# Patient Record
Sex: Female | Born: 1940 | Race: White | Hispanic: No | Smoking: Never smoker
Health system: Southern US, Community
[De-identification: ages and names within clinical notes are randomized; demographics above are authoritative.]

## PROBLEM LIST (undated history)

## (undated) DIAGNOSIS — G40109 Localization-related (focal) (partial) symptomatic epilepsy and epileptic syndromes with simple partial seizures, not intractable, without status epilepticus: Secondary | ICD-10-CM

## (undated) DIAGNOSIS — R569 Unspecified convulsions: Secondary | ICD-10-CM

## (undated) DIAGNOSIS — R260 Ataxic gait: Secondary | ICD-10-CM

## (undated) DIAGNOSIS — H3560 Retinal hemorrhage, unspecified eye: Secondary | ICD-10-CM

## (undated) DIAGNOSIS — E039 Hypothyroidism, unspecified: Secondary | ICD-10-CM

## (undated) DIAGNOSIS — R531 Weakness: Secondary | ICD-10-CM

## (undated) HISTORY — PX: BRAIN SURGERY: SHX531

## (undated) HISTORY — DX: Retinal hemorrhage, unspecified eye: H35.60

## (undated) HISTORY — DX: Weakness: R53.1

## (undated) HISTORY — PX: TUBAL LIGATION: SHX77

## (undated) HISTORY — PX: CHOLECYSTECTOMY: SHX55

## (undated) HISTORY — PX: ANKLE SURGERY: SHX546

## (undated) HISTORY — PX: OTHER SURGICAL HISTORY: SHX169

## (undated) HISTORY — DX: Hypothyroidism, unspecified: E03.9

---

## 1988-03-31 DIAGNOSIS — M21379 Foot drop, unspecified foot: Secondary | ICD-10-CM | POA: Insufficient documentation

## 2006-05-07 LAB — HM MAMMOGRAPHY: HM Mammogram: NORMAL

## 2009-04-19 ENCOUNTER — Ambulatory Visit: Payer: Self-pay | Admitting: Family Medicine

## 2009-04-19 DIAGNOSIS — I10 Essential (primary) hypertension: Secondary | ICD-10-CM | POA: Insufficient documentation

## 2009-04-19 DIAGNOSIS — E039 Hypothyroidism, unspecified: Secondary | ICD-10-CM | POA: Insufficient documentation

## 2009-04-26 ENCOUNTER — Encounter: Payer: Self-pay | Admitting: Family Medicine

## 2009-05-02 ENCOUNTER — Telehealth: Payer: Self-pay | Admitting: Family Medicine

## 2009-05-03 ENCOUNTER — Ambulatory Visit: Payer: Self-pay | Admitting: Family Medicine

## 2009-05-27 ENCOUNTER — Ambulatory Visit: Payer: Self-pay | Admitting: Family Medicine

## 2010-01-17 ENCOUNTER — Encounter: Payer: Self-pay | Admitting: Family Medicine

## 2010-01-19 ENCOUNTER — Encounter: Payer: Self-pay | Admitting: Family Medicine

## 2010-01-22 ENCOUNTER — Encounter: Payer: Self-pay | Admitting: Family Medicine

## 2010-02-08 ENCOUNTER — Ambulatory Visit: Payer: Self-pay | Admitting: Family Medicine

## 2010-02-08 LAB — CONVERTED CEMR LAB
Bilirubin Urine: NEGATIVE
Glucose, Urine, Semiquant: NEGATIVE
Ketones, urine, test strip: NEGATIVE
Nitrite: NEGATIVE

## 2010-02-09 ENCOUNTER — Encounter: Payer: Self-pay | Admitting: Family Medicine

## 2010-02-09 LAB — CONVERTED CEMR LAB
ALT: 12 units/L (ref 0–35)
Albumin: 4.1 g/dL (ref 3.5–5.2)
BUN: 9 mg/dL (ref 6–23)
CO2: 25 meq/L (ref 19–32)
Calcium: 10.1 mg/dL (ref 8.4–10.5)
Chloride: 105 meq/L (ref 96–112)
Creatinine, Ser: 0.88 mg/dL (ref 0.40–1.20)
HCT: 35.9 % — ABNORMAL LOW (ref 36.0–46.0)
Hemoglobin: 11.3 g/dL — ABNORMAL LOW (ref 12.0–15.0)
Potassium: 4.4 meq/L (ref 3.5–5.3)
TSH: 1.651 microintl units/mL (ref 0.350–4.500)
WBC: 5.4 10*3/uL (ref 4.0–10.5)

## 2010-03-17 ENCOUNTER — Telehealth: Payer: Self-pay | Admitting: Family Medicine

## 2010-03-27 ENCOUNTER — Ambulatory Visit: Payer: Self-pay | Admitting: Family Medicine

## 2010-03-27 DIAGNOSIS — N281 Cyst of kidney, acquired: Secondary | ICD-10-CM | POA: Insufficient documentation

## 2010-04-07 ENCOUNTER — Encounter: Payer: Self-pay | Admitting: Family Medicine

## 2010-04-07 ENCOUNTER — Encounter
Admission: RE | Admit: 2010-04-07 | Discharge: 2010-04-07 | Payer: Self-pay | Source: Home / Self Care | Admitting: Family Medicine

## 2010-04-07 ENCOUNTER — Ambulatory Visit: Payer: Self-pay | Admitting: Family Medicine

## 2010-04-26 ENCOUNTER — Telehealth: Payer: Self-pay | Admitting: Family Medicine

## 2010-05-09 ENCOUNTER — Telehealth: Payer: Self-pay | Admitting: Family Medicine

## 2010-06-06 NOTE — Assessment & Plan Note (Signed)
Summary: Flu   Vital Signs:  Patient profile:   70 year old female Height:      68.5 inches Weight:      204 pounds O2 Sat:      97 % on Room air Temp:     99.0 degrees F oral Pulse rate:   86 / minute BP sitting:   137 / 77  (left arm) Cuff size:   large  Vitals Entered By: Kathlene November (May 27, 2009 2:17 PM)  O2 Flow:  Room air  Serial Vital Signs/Assessments:                                PEF    PreRx  PostRx Time      O2 Sat  O2 Type     L/min  L/min  L/min   By 2:49 PM                       300    330    390     Kim Johnson  Comments: 2:49 PM Pt in green zone By: Kathlene November   CC: cough, head and chest congestion, H/A, temp of 102 Monday, some nausea, diarrhea   Primary Care Provider:  Nani Gasser MD  CC:  cough, head and chest congestion, H/A, temp of 102 Monday, some nausea, and diarrhea.  History of Present Illness: cough, head and chest congestion, H/A, temp of 102 Monday, some nausea, diarrhea.  Took some pepto and now stools are black.  + chills.  This am 100.4.  No flu shot. Back hurts but no myalgias. completed the cough medicine. Tylenol and triamenic.   Current Medications (verified): 1)  Synthroid 88 Mcg Tabs (Levothyroxine Sodium) .... Take One Tablet By Mouth Oncea  Day 2)  Fexofenadine Hcl 180 Mg Tabs (Fexofenadine Hcl) .... Take 1 Tablet By Mouth Once A Day  Allergies (verified): 1)  ! Pcn  Comments:  Nurse/Medical Assistant: The patient's medications and allergies were reviewed with the patient and were updated in the Medication and Allergy Lists. Kathlene November (May 27, 2009 2:18 PM)  Physical Exam  General:  Well-developed,well-nourished,in no acute distress; alert,appropriate and cooperative throughout examination Head:  Normocephalic and atraumatic without obvious abnormalities. No apparent alopecia or balding. Eyes:  No corneal or conjunctival inflammation noted. EOMI. Perrla.  Ears:  External ear exam shows no  significant lesions or deformities.  Otoscopic examination reveals clear canals, tympanic membranes are intact bilaterally without bulging, retraction, inflammation or discharge. Hearing is grossly normal bilaterally. Nose:  External nasal examination shows no deformity or inflammation. Nasal mucosa are pink and moist without lesions or exudates. Mouth:  Oral mucosa and oropharynx without lesions or exudates.  Teeth in good repair. Neck:  No deformities, masses, or tenderness noted. Lungs:  Normal respiratory effort, chest expands symmetrically. Lungs are clear to auscultation, no crackles or wheezes. Heart:  Normal rate and regular rhythm. S1 and S2 normal without gallop, murmur, click, rub or other extra sounds. Pulses:  Radial 2+ , reg.  Neurologic:  alert & oriented X3.   Skin:  no rashes.  no rashes.   Cervical Nodes:  No lymphadenopathy noted Psych:  Cognition and judgment appear intact. Alert and cooperative with normal attention span and concentration. No apparent delusions, illusions, hallucinations   Impression & Recommendations:  Problem # 1:  INFLUENZA (ICD-487.8)  Rest, increase fluids, use Tylenol [phone] mg  every 4-6 hours, and avoid contact with others. call office if no improvement in 5-7 days or if symptoms worsen.  Not a candidate for tamiflu.   Orders: Flu A+B (40981)  Complete Medication List: 1)  Synthroid 88 Mcg Tabs (Levothyroxine sodium) .... Take one tablet by mouth oncea  day 2)  Hydrocodone-homatropine 5-1.5 Mg/55ml Syrp (Hydrocodone-homatropine) .... 5ml by mouth at bedtime as needed cough 3)  Fexofenadine Hcl 180 Mg Tabs (Fexofenadine hcl) .... Take 1 tablet by mouth once a day Prescriptions: HYDROCODONE-HOMATROPINE 5-1.5 MG/5ML SYRP (HYDROCODONE-HOMATROPINE) 5ml by mouth at bedtime as needed cough  #142ml x 0   Entered and Authorized by:   Nani Gasser MD   Signed by:   Nani Gasser MD on 05/27/2009   Method used:   Print then Give to  Patient   RxID:   [id number]   Laboratory Results  Date/Time Received: 05/27/2009 Date/Time Reported: 05/27/2009  Other Tests  Influenza A: positive Influenza B: positive

## 2010-06-06 NOTE — Assessment & Plan Note (Signed)
Summary: Right knee pain   Vital Signs:  Patient profile:   70 year old female Height:      68.5 inches Weight:      196 pounds Pulse rate:   78 / minute BP sitting:   141 / 82  (right arm) Cuff size:   regular  Vitals Entered By: Avon Gully CMA, Duncan Dull) (April 07, 2010 1:08 PM) CC: rt knee pain   Primary Care Provider:  Nani Gasser MD  CC:  rt knee pain.  History of Present Illness: April Dickerson at home and she called her friend, who called EMS. They evaluated her and she didn't go to the hospital.   Happened a week ago Friday ( 1 week ago). Right knee isn't swollen. Says it ws getting a little better but now worse again. It kep her up last night.  Hx of 2 torn ligament repairs on the right knee.  Pain is along the medial knee and in the back of the knee. Hasn't taken any pain med relievers.  Pain woke her up last night.  Has been icing it. She has gait difficulty with her left knee s/p brain surgery and uses a 4 prong cane.   Current Medications (verified): 1)  Synthroid 88 Mcg Tabs (Levothyroxine Sodium) .... Take One Tablet By Mouth Oncea  Day 2)  Fexofenadine Hcl 180 Mg Tabs (Fexofenadine Hcl) .... Take 1 Tablet By Mouth Once A Day 3)  Microzide 12.5 Mg Caps (Hydrochlorothiazide) .... Take 1 Tablet By Mouth Once A Day  Allergies (verified): 1)  ! Pcn  Comments:  Nurse/Medical Assistant: The patient's medications and allergies were reviewed with the patient and were updated in the Medication and Allergy Lists. Avon Gully CMA, Duncan Dull) (April 07, 2010 1:09 PM)  Past History:  Past Surgical History: Brain surgery x 3 for hydrocephalous in her 39s.   Cholecystectomy 1980 Right knee with surgical repair of ligaments x 2  Physical Exam  General:  Well-developed,well-nourished,in no acute distress; alert,appropriate and cooperative throughout examination Msk:  Right knee with midl swelling. Tender along bilateral joint lines.  No locking. No crepitus.  IN  creased laxity with anterior drawer. She also has inc laxity with ant drawer on the left. Strength 5/5   Impression & Recommendations:  Problem # 1:  KNEE PAIN, RIGHT (ICD-719.46) Assessment New Discussed will treat conservatively Given pain medication for nighttime. Can use Tylenol during the day. PRICE therapy.  Will get xray since fall and rule out fracture.   If not getting better in 2-3 weeks and the xray is neg then refer to ortho for further evaluation.   Her updated medication list for this problem includes:    Tylenol With Codeine #3 300-30 Mg Tabs (Acetaminophen-codeine) .Marland Kitchen... 1-2 tab by mouth every 6 hours as needed severe pain.  Orders: T-DG Knee 2 Views*R* (73560)  Complete Medication List: 1)  Synthroid 88 Mcg Tabs (Levothyroxine sodium) .... Take one tablet by mouth oncea  day 2)  Fexofenadine Hcl 180 Mg Tabs (Fexofenadine hcl) .... Take 1 tablet by mouth once a day 3)  Microzide 12.5 Mg Caps (Hydrochlorothiazide) .... Take 1 tablet by mouth once a day 4)  Tylenol With Codeine #3 300-30 Mg Tabs (Acetaminophen-codeine) .Marland Kitchen.. 1-2 tab by mouth every 6 hours as needed severe pain.  Patient Instructions: 1)  We will call you with the xray results  2)  Can you the pain mediction as needed  Prescriptions: TYLENOL WITH CODEINE #3 300-30 MG TABS (ACETAMINOPHEN-CODEINE) 1-2  tab by mouth every 6 hours as needed severe pain.  #45 x 0   Entered and Authorized by:   Nani Gasser MD   Signed by:   Nani Gasser MD on 04/07/2010   Method used:   Printed then faxed to ...       [facility] [phone]* (retail)       [address] [place], [place]  [address]       Ph: [phone]       Fax: [phone]   RxID:   [id number]    Orders Added: 1)  T-DG Knee 2 Views*R* [73560] 2)  Est. Patient Level IV [60737]

## 2010-06-06 NOTE — Letter (Signed)
Summary: Internal Correspondence-call a nurse report  Internal Correspondence-call a nurse report   Imported By: Vanessa Swaziland 05/27/2009 08:18:35  _____________________________________________________________________  External Attachment:    Type:   Image     Comment:   Internal Document

## 2010-06-06 NOTE — Progress Notes (Signed)
Summary: pt request a referral   Phone Note Call from Patient   Caller: Patient Summary of Call: Pt is asking for a referral to Dr. Burnett Corrente  in [place] be faxed to [phone]... Call pt. and let her know when this has been faxed so they will let her schedule a appt with them, [phone] Initial call taken by: Michaelle Copas,  March 17, 2010 12:26 PM  Follow-up for Phone Call        What for?  Have I evaluated ths problem?  Follow-up by: Nani Gasser MD,  March 17, 2010 12:56 PM  Additional Follow-up for Phone Call Additional follow up Details #1::        Her Brain Surgeon has passed away and now she needs a f/u with a Neurologist... She has a Hx. of Brain Surgery and likes to have a f/u every so often... Additional Follow-up by: Michaelle Copas,  March 17, 2010 3:02 PM

## 2010-06-06 NOTE — Letter (Signed)
Summary: [facility]  [facility]   Imported By: Lanelle Bal 02/17/2010 10:11:34  _____________________________________________________________________  External Attachment:    Type:   Image     Comment:   External Document

## 2010-06-06 NOTE — Assessment & Plan Note (Signed)
Summary: 6 week f/u BP, renal cysts   Vital Signs:  Patient profile:   70 year old female Height:      68.5 inches Weight:      196 pounds Pulse rate:   77 / minute BP sitting:   139 / 77  (right arm) Cuff size:   regular  Vitals Entered By: Avon Gully CMA, Duncan Dull) (March 27, 2010 1:04 PM)  Serial Vital Signs/Assessments:  Time      Position  BP       Pulse  Resp  Temp     By 1:08 PM             145/72                         Avon Gully CMA, (AAMA)  CC: f/u BP   Primary Care Provider:  Nani Gasser MD  CC:  f/u BP.  History of Present Illness: Down 5 lb.s Has really cut out the salt in her diet.  She is doing well.   Current Medications (verified): 1)  Synthroid 88 Mcg Tabs (Levothyroxine Sodium) .... Take One Tablet By Mouth Oncea  Day 2)  Fexofenadine Hcl 180 Mg Tabs (Fexofenadine Hcl) .... Take 1 Tablet By Mouth Once A Day  Allergies (verified): 1)  ! Pcn  Comments:  Nurse/Medical Assistant: The patient's medications and allergies were reviewed with the patient and were updated in the Medication and Allergy Lists. Avon Gully CMA, Duncan Dull) (March 27, 2010 1:08 PM)  Physical Exam  General:  Well-developed,well-nourished,in no acute distress; alert,appropriate and cooperative throughout examination Head:  Normocephalic and atraumatic without obvious abnormalities. No apparent alopecia or balding. Lungs:  Normal respiratory effort, chest expands symmetrically. Lungs are clear to auscultation, no crackles or wheezes. Heart:  Normal rate and regular rhythm. S1 and S2 normal without gallop, murmur, click, rub or other extra sounds. Skin:  no rashes.     Impression & Recommendations:  Problem # 1:  HYPERTENSION, BENIGN (ICD-401.1) Discussed different options. I am very happy about her weight loss. She has dong a great job changing her diet.  Will start wtih low dose diuretic adn follow up in one month. Check BMET at that time.  Her updated  medication list for this problem includes:    Microzide 12.5 Mg Caps (Hydrochlorothiazide) .Marland Kitchen... Take 1 tablet by mouth once a day  BP today: 139/77 Prior BP: 142/70 (02/08/2010)  Labs Reviewed: Creat: 0.88 (02/08/2010)  Instructed in low sodium diet (DASH Handout) and behavior modification.    Problem # 2:  RENAL CYST (ICD-593.2) Will f/u wiht repeat US at [facility] in 3 months.   Complete Medication List: 1)  Synthroid 88 Mcg Tabs (Levothyroxine sodium) .... Take one tablet by mouth oncea  day 2)  Fexofenadine Hcl 180 Mg Tabs (Fexofenadine hcl) .... Take 1 tablet by mouth once a day 3)  Microzide 12.5 Mg Caps (Hydrochlorothiazide) .... Take 1 tablet by mouth once a day  Patient Instructions: 1)  Please schedule a follow-up appointment in 1 month to recheck BP.  Prescriptions: SYNTHROID 88 MCG TABS (LEVOTHYROXINE SODIUM) Take one tablet by mouth oncea  day Brand medically necessary #90 x 1   Entered and Authorized by:   Nani Gasser MD   Signed by:   Nani Gasser MD on 03/27/2010   Method used:   Print then Give to Patient   RxID:   [id number] MICROZIDE 12.5 MG CAPS (HYDROCHLOROTHIAZIDE) Take 1 tablet  by mouth once a day  #30 x 2   Entered and Authorized by:   Nani Gasser MD   Signed by:   Nani Gasser MD on 03/27/2010   Method used:   Print then Give to Patient   RxID:   [id number]    Orders Added: 1)  Est. Patient Level III [56213]

## 2010-06-06 NOTE — Assessment & Plan Note (Signed)
Summary: Hospital f/u   Vital Signs:  Patient profile:   70 year old female Height:      68.5 inches Weight:      201 pounds Pulse rate:   74 / minute BP sitting:   142 / 70  (right arm) Cuff size:   regular  Vitals Entered By: Avon Gully CMA, Duncan Dull) (February 08, 2010 8:22 AM) CC: hospital f/u   Serial Vital Signs/Assessments:  Time      Position  BP       Pulse  Resp  Temp     By 8:22 AM             126/72                         Avon Gully CMA, (Duncan Dull)   Primary Care Provider:  Nani Gasser MD  CC:  hospital f/u.  History of Present Illness: Admitted to hospital for RLL PNA and Left pyelnephritis.  Admitted with low BP and probable septsis.  We are called to get her actual D/C summary. Completed her course of Cipro one week ago. Feeling much better.  Still some swelling the left ankle.  Lost 20 lbs. Dec appetite now.  Eating less than 1500mg  of salt. Taking her syntrhoid and her allegra as needed.  No SOB or fever.  Less urinary frequency.   Current Medications (verified): 1)  Synthroid 88 Mcg Tabs (Levothyroxine Sodium) .... Take One Tablet By Mouth Oncea  Day 2)  Fexofenadine Hcl 180 Mg Tabs (Fexofenadine Hcl) .... Take 1 Tablet By Mouth Once A Day  Allergies (verified): 1)  ! Pcn  Comments:  Nurse/Medical Assistant: The patient's medications and allergies were reviewed with the patient and were updated in the Medication and Allergy Lists. Avon Gully CMA, Duncan Dull) (February 08, 2010 8:23 AM)  Past History:  Past Surgical History: Last updated: 04/19/2009 Brain surgery x 3 for hydrocephalous in her 40s.   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  [employer].  2 adult kids.  Former Smoker Alcohol use-no Drug use-no Regular exercise-yes  Physical Exam  General:  Well-developed,well-nourished,in no acute distress; alert,appropriate and cooperative throughout examination Head:  Normocephalic  and atraumatic without obvious abnormalities. No apparent alopecia or balding. Neck:  No deformities, masses, or tenderness noted. Lungs:  Normal respiratory effort, chest expands symmetrically. Lungs are clear to auscultation, no crackles or wheezes. Heart:  Normal rate and regular rhythm. S1 and S2 normal without gallop, murmur, click, rub or other extra sounds. No abdominal bruits.  Abdomen:  Bowel sounds positive,abdomen soft and non-tender without masses, organomegaly or hernias noted. Msk:  Trace edema in the left ankle.  Neurologic:  alert & oriented X3.  Needs assistance to get up on the exam table. Uses a cane.  Skin:  no rashes.   Cervical Nodes:  No lymphadenopathy noted Psych:  Cognition and judgment appear intact. Alert and cooperative with normal attention span and concentration. No apparent delusions, illusions, hallucinations   Impression & Recommendations:  Problem # 1:  PNEUMONIA (ICD-486) Doing well. She is much improved.  Will check lab work to make sure blood counts are normal.   Lung exam is normal.  Also completed her handicap driver placard form.  Orders: T-Comprehensive Metabolic Panel ([id number]) T-CBC No Diff ([id number])  Problem # 2:  PYELONEPHRITIS (ICD-590.80)  Recheck UA to make sure infection resolved from her pyelonepritis.  Orders: T-CBC  No Diff ([id number]) T-Urine Culture (Spectrum Order) ([phone]) UA Dipstick w/o Micro (automated)  (81003)  Encouraged to push clear liquids, get enough rest, and take acetaminophen as needed. To be seen in 10 days if no improvement, sooner if worse.  Problem # 3:  UNSPECIFIED HYPOTHYROIDISM (ICD-244.9) Due to recheck level.  Her updated medication list for this problem includes:    Synthroid 88 Mcg Tabs (Levothyroxine sodium) .Marland Kitchen... Take one tablet by mouth oncea  day  Orders: T-TSH ([id number])  Problem # 4:  ELEVATED BLOOD PRESSURE WITHOUT DIAGNOSIS OF HYPERTENSION (ICD-796.2) Up today but still  recovering. Last BP here was OK. Will have her f/u in 6 weeks to recheck.   Complete Medication List: 1)  Synthroid 88 Mcg Tabs (Levothyroxine sodium) .... Take one tablet by mouth oncea  day 2)  Fexofenadine Hcl 180 Mg Tabs (Fexofenadine hcl) .... Take 1 tablet by mouth once a day  Contraindications/Deferment of Procedures/Staging:    Test/Procedure: FLU VAX    Reason for deferment: patient declined   Patient Instructions: 1)  Please schedule a follow-up appointment in 6 weeks for blood pressure. 2)  We will call you with the lab results.   3)  Think about the shingles and pneumonia vaccines  Laboratory Results   Urine Tests  Date/Time Received: 02/08/10 Date/Time Reported: 02/08/10  Routine Urinalysis   Color: yellow Appearance: Clear Glucose: negative   (Normal Range: Negative) Bilirubin: negative   (Normal Range: Negative) Ketone: negative   (Normal Range: Negative) Spec. Gravity: 1.010   (Normal Range: 1.003-1.035) Blood: trace-intact   (Normal Range: Negative) pH: 6.0   (Normal Range: 5.0-8.0) Protein: negative   (Normal Range: Negative) Urobilinogen: 0.2   (Normal Range: 0-1) Nitrite: negative   (Normal Range: Negative) Leukocyte Esterace: small   (Normal Range: Negative)

## 2010-06-06 NOTE — Consult Note (Signed)
Summary: [facility]  [facility]   Imported By: Lanelle Bal 05/13/2009 09:20:20  _____________________________________________________________________  External Attachment:    Type:   Image     Comment:   External Document

## 2010-06-06 NOTE — Letter (Signed)
Summary: Meds & Instructions/[facility]  Meds & Instructions/[facility]   Imported By: Lanelle Bal 02/01/2010 13:47:25  _____________________________________________________________________  External Attachment:    Type:   Image     Comment:   External Document

## 2010-06-08 NOTE — Progress Notes (Signed)
Summary: Needs renal US  ---- Converted from flag ---- ---- 03/27/2010 1:15 PM, Nani Gasser MD wrote: Needs f/u US of thekidneys to folllow her cysts seen on CT abd in September. ------------------------------  10:14 AM McCrimmon CMA, (AAMA), Sue Lush May 09, 2010' left message on pt's vm with above

## 2010-06-08 NOTE — Progress Notes (Signed)
Summary: renal doppler  Phone Note Call from Patient   Caller: Patient Call For: Nani Gasser MD Summary of Call: pt called and states she did not want to schedule renal dopplers at this time because she has too many other medical expenses to pay for and will call back when she wants [place] to schedule it  Initial call taken by: Avon Gully CMA, Duncan Dull),  May 09, 2010 10:19 AM

## 2010-06-08 NOTE — Progress Notes (Signed)
Summary: Tylenol #3 refill  Phone Note Call from Patient Call back at [phone]   Caller: Patient Call For: April Gasser MD Summary of Call: Pt calls and states is going out of town to [place] for 1-2 months and wanted to know if you would call in her Tylenol #3 to [facility] for her Initial call taken by: Kathlene November LPN,  April 26, 2010 1:23 PM  Follow-up for Phone Call        OK will send.  Follow-up by: April Gasser MD,  April 26, 2010 1:29 PM  Additional Follow-up for Phone Call Additional follow up Details #1::        Pt notifeid. Med faxed Additional Follow-up by: Kathlene November LPN,  April 26, 2010 1:34 PM    Prescriptions: TYLENOL WITH CODEINE #3 300-30 MG TABS (ACETAMINOPHEN-CODEINE) 1-2 tab by mouth every 6 hours as needed severe pain.  #45 x 0   Entered and Authorized by:   April Gasser MD   Signed by:   April Gasser MD on 04/26/2010   Method used:   Printed then faxed to ...       [facility] [phone]* (retail)       [address] [place], [place]  [address]       Ph: [phone]       Fax: [phone]   RxID:   [id number]

## 2010-12-18 ENCOUNTER — Telehealth: Payer: Self-pay | Admitting: Family Medicine

## 2010-12-18 ENCOUNTER — Ambulatory Visit (INDEPENDENT_AMBULATORY_CARE_PROVIDER_SITE_OTHER): Payer: Medicare Other | Admitting: Family Medicine

## 2010-12-18 VITALS — BP 143/72 | HR 68 | Wt 200.0 lb

## 2010-12-18 DIAGNOSIS — I10 Essential (primary) hypertension: Secondary | ICD-10-CM

## 2010-12-18 DIAGNOSIS — Z23 Encounter for immunization: Secondary | ICD-10-CM

## 2010-12-18 NOTE — Telephone Encounter (Signed)
error 

## 2010-12-18 NOTE — Progress Notes (Signed)
  Subjective:    Patient ID: April Dickerson, female    DOB: 13-Feb-1941, 70 y.o.   MRN: [id number]  HPI  Pt denies chest pain, SOB, dizziness, or heart palpitations.  5 min spent with pt. TB skin test placed w/out complications     Review of Systems     Objective:   Physical Exam        Assessment & Plan:

## 2010-12-18 NOTE — Progress Notes (Signed)
  Subjective:    Patient ID: April Dickerson, female    DOB: 05-23-1940, 70 y.o.   MRN: [id number]  HPI    Review of Systems     Objective:   Physical Exam        Assessment & Plan:  HTN - not well controlled.  Needs make appt with MD.

## 2010-12-20 LAB — TB SKIN TEST

## 2010-12-27 ENCOUNTER — Encounter: Payer: Self-pay | Admitting: Family Medicine

## 2010-12-28 ENCOUNTER — Encounter: Payer: Self-pay | Admitting: Family Medicine

## 2010-12-28 ENCOUNTER — Ambulatory Visit (INDEPENDENT_AMBULATORY_CARE_PROVIDER_SITE_OTHER): Payer: Medicare Other | Admitting: Family Medicine

## 2010-12-28 VITALS — BP 124/66 | HR 78 | Wt 202.0 lb

## 2010-12-28 DIAGNOSIS — Z13220 Encounter for screening for lipoid disorders: Secondary | ICD-10-CM

## 2010-12-28 DIAGNOSIS — D239 Other benign neoplasm of skin, unspecified: Secondary | ICD-10-CM

## 2010-12-28 DIAGNOSIS — E039 Hypothyroidism, unspecified: Secondary | ICD-10-CM

## 2010-12-28 DIAGNOSIS — IMO0001 Reserved for inherently not codable concepts without codable children: Secondary | ICD-10-CM

## 2010-12-28 DIAGNOSIS — D229 Melanocytic nevi, unspecified: Secondary | ICD-10-CM

## 2010-12-28 DIAGNOSIS — R030 Elevated blood-pressure reading, without diagnosis of hypertension: Secondary | ICD-10-CM

## 2010-12-28 MED ORDER — ACETAMINOPHEN-CODEINE 300-30 MG PO TABS: 2.0000 | ORAL_TABLET | Freq: Four times a day (QID) | ORAL | Status: DC | PRN

## 2010-12-28 MED ORDER — DIPHENOXYLATE-ATROPINE 2.5-0.025 MG PO TABS: 1.0000 | ORAL_TABLET | Freq: Four times a day (QID) | ORAL | Status: AC | PRN

## 2010-12-28 MED ORDER — LEVOTHYROXINE SODIUM 88 MCG PO TABS: 88.0000 ug | ORAL_TABLET | Freq: Every day | ORAL | Status: DC

## 2010-12-28 NOTE — Progress Notes (Signed)
  Subjective:    Patient ID: April Dickerson, female    DOB: January 13, 1941, 70 y.o.   MRN: [id number]  HPI Felt more dizzy than usual, notice it turning in bed on Tuesday AM. Then woke up nauseated on Wed.  Felt like a "turning dizzy" sensation.  Had some pain and itch in her LT ear. Had  Leg cramp one night, thinks maybe the Rt knee.  Usually takes tonic water with quinine.  Note she does have a history of gait abnormality. No fever. No sore throat. No hearing changes. No head trauma that she does have a prior history of brain surgery.   Review of Systems     Objective:   Physical Exam  Constitutional: She is oriented to person, place, and time. She appears well-developed and well-nourished.  HENT:  Head: Normocephalic and atraumatic.  Right Ear: External ear normal.  Left Ear: External ear normal.       Left TM and canal were normal. She did have cerumen blocking the canal initially but this was manually disimpacted after a trial of irrigation. The right TM and canal are clear.  Neck: Neck supple. No thyromegaly present.  Cardiovascular: Normal rate, regular rhythm and normal heart sounds.   Pulmonary/Chest: Effort normal and breath sounds normal.  Lymphadenopathy:    She has no cervical adenopathy.  Neurological: She is alert and oriented to person, place, and time. No cranial nerve deficit.       Patellar reflexes 2+. She had a positive Dix-Hallpike maneuver to the left. Speech is normal.  Skin: Skin is warm and dry.  Psychiatric: She has a normal mood and affect. Her behavior is normal.          Assessment & Plan:  Benign positional vertigo-discussed the most likely diagnosis with the patient. She was given handout and demonstrated how to do exercises too fatigued response as well as improve her symptoms. She can certainly use over-the-counter meclizine as needed if she would like. If she's not improving over the next 2 weeks please call our office if she does have some other  risk factors such as prior brain surgery. Her ear exam is normal which is reassuring. Also recommend avoiding quick movements.  Note she would also like a refill on her Tylenol #3 as well to [place] since she is leaving for a trip soon. She declined a flu vaccine today.

## 2010-12-28 NOTE — Patient Instructions (Addendum)
Check out the DASH diet for weight loss.  Follow up in 6 months.  Can start the exercises for vertigo. You can meclizine prn (this is over the counter for dizziness if would like. Thought it won't cure anything.  Call if you are not better in about 2 weeks.

## 2010-12-28 NOTE — Assessment & Plan Note (Signed)
She is to recheck her level today. Lab slip given.

## 2011-02-08 ENCOUNTER — Ambulatory Visit: Payer: Medicare Other | Admitting: Family Medicine

## 2011-02-08 DIAGNOSIS — Z0289 Encounter for other administrative examinations: Secondary | ICD-10-CM

## 2011-03-21 ENCOUNTER — Encounter: Payer: Self-pay | Admitting: Emergency Medicine

## 2011-03-21 ENCOUNTER — Emergency Department (INDEPENDENT_AMBULATORY_CARE_PROVIDER_SITE_OTHER)
Admission: EM | Admit: 2011-03-21 | Discharge: 2011-03-21 | Disposition: A | Discharge status: Home / Self Care | Payer: Medicare Other | Source: Home / Self Care | Attending: Family Medicine | Admitting: Family Medicine

## 2011-03-21 DIAGNOSIS — M79641 Pain in right hand: Secondary | ICD-10-CM

## 2011-03-21 DIAGNOSIS — M79609 Pain in unspecified limb: Secondary | ICD-10-CM

## 2011-03-21 NOTE — ED Provider Notes (Signed)
History     CSN: [id number] Arrival date & time: 03/21/2011 11:23 AM   First MD Initiated Contact with Patient 03/21/11 1201      Chief Complaint  Patient presents with  . Hand Pain     HPI Comments: Patient complains of two week history of stiffness and soreness in the palm of her right hand centered over the 3rd MCP joint.  There has been mild swelling.  No known trauma.  Patient is a 70 y.o. female presenting with hand pain. The history is provided by the patient.  Hand Pain This is a new problem. The current episode started more than 1 week ago. The problem occurs constantly. The problem has been gradually worsening. Exacerbated by: grasping. The symptoms are relieved by nothing. Treatments tried: analgesic. The treatment provided mild relief.    Past Medical History  Diagnosis Date  . Weakness of left side of body     chronic/status post brain surgery    Past Surgical History  Procedure Date  . Cholecystectomy   . Rt knee surgery     surgical repair of ligaments X 2  . Brain surgery     History reviewed. No pertinent family history.  History  Substance Use Topics  . Smoking status: Former Games developer  . Smokeless tobacco: Not on file  . Alcohol Use: No    OB History    Grav Para Term Preterm Abortions TAB SAB Ect Mult Living                  Review of Systems  Constitutional: Negative.   Musculoskeletal: Positive for joint swelling.       Pain and stiffness when grasping with right hand     Allergies  Penicillins  Home Medications   Current Outpatient Rx  Name Route Sig Dispense Refill  . ACETAMINOPHEN-CODEINE 300-30 MG PO TABS Oral Take 2 tablets by mouth every 6 (six) hours as needed. 60 tablet 0  . HYDROCHLOROTHIAZIDE 12.5 MG PO CAPS Oral Take 12.5 mg by mouth every morning.      Marland Kitchen LEVOTHYROXINE SODIUM 88 MCG PO TABS Oral Take 1 tablet (88 mcg total) by mouth daily. 30 tablet 6    BP 156/76  Pulse 74  Temp(Src) 97.7 F (36.5 C) (Oral)  Resp  18  Ht 5\' 9"  (1.753 m)  Wt 204 lb (92.534 kg)  BMI 30.13 kg/m2  SpO2 96%  Physical Exam  Constitutional: She appears well-developed and well-nourished. No distress.  Musculoskeletal:       Hands:      Mild tenderness to palpation over 3rd MCP joint palmar surface, extending proximally to palmar flexor tendon.  Flexor tendon is rather prominent.  Third finger has good range of motion.  Distal Neurovascular function is intact.     ED Course  Procedures None      1. Hand pain, right       MDM  X-ray of right hand ordered; patient stated that she had to go to work and would return later in day for x-ray.  However, she did not return for hand X-ray.  Based on exam and history, suspect either a tendonitis of hand flexor tendon, or possibly early Dupuytren's contracture.  Followup call on 03/23/11:  Patient states that she plans to visit an orthopedist for her hand problem.        Donna Christen, MD 03/23/11 [phone]

## 2011-03-21 NOTE — ED Notes (Signed)
Rt hand pain x 2 weeks, denies trauma

## 2011-06-24 IMAGING — CR DG KNEE COMPLETE 4+V*R*
4 series · 4 of 4 positions shown · non-contrast
Comparison: None.

CLINICAL DATA: 69-year-old female with right knee pain, history of
fall.  Swelling.

RIGHT KNEE - COMPLETE 4+ VIEW

[view not recorded (1 of 4)]
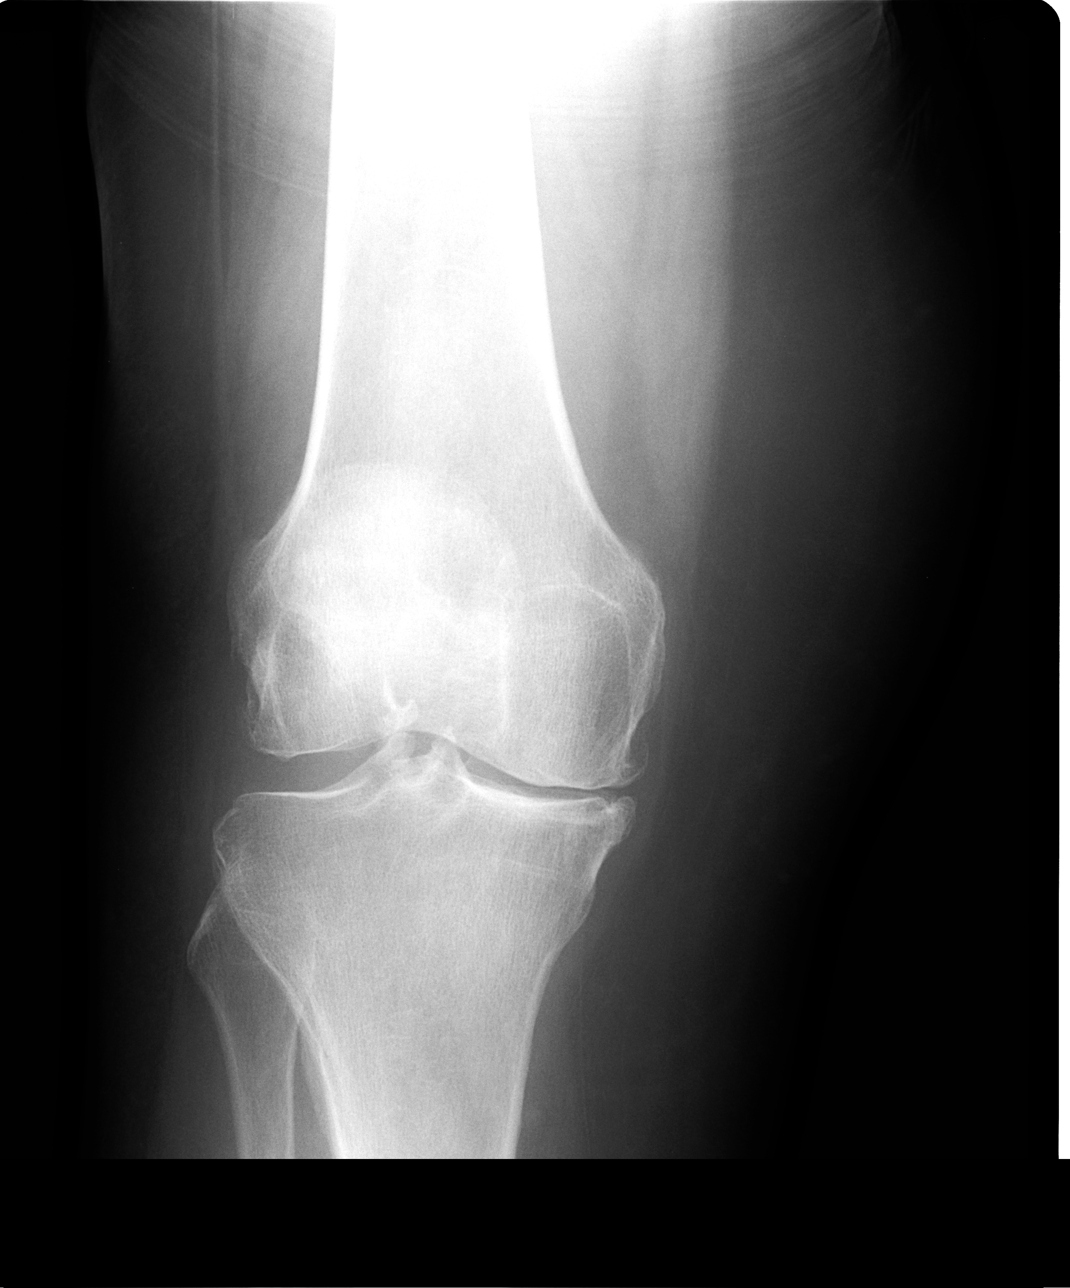

[view not recorded (2 of 4)]
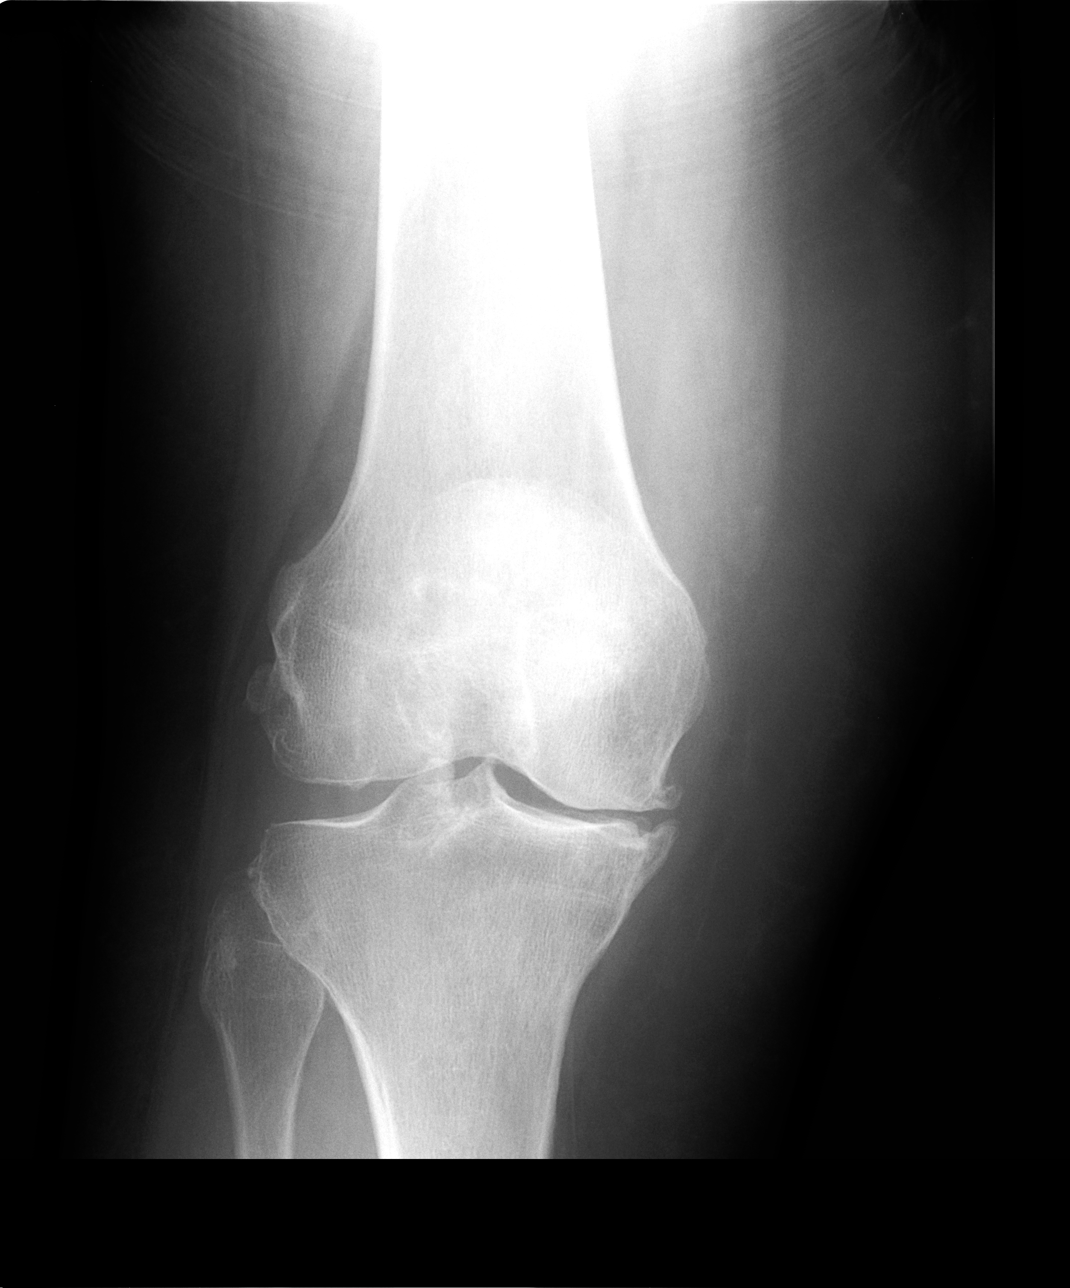

[view not recorded (3 of 4)]
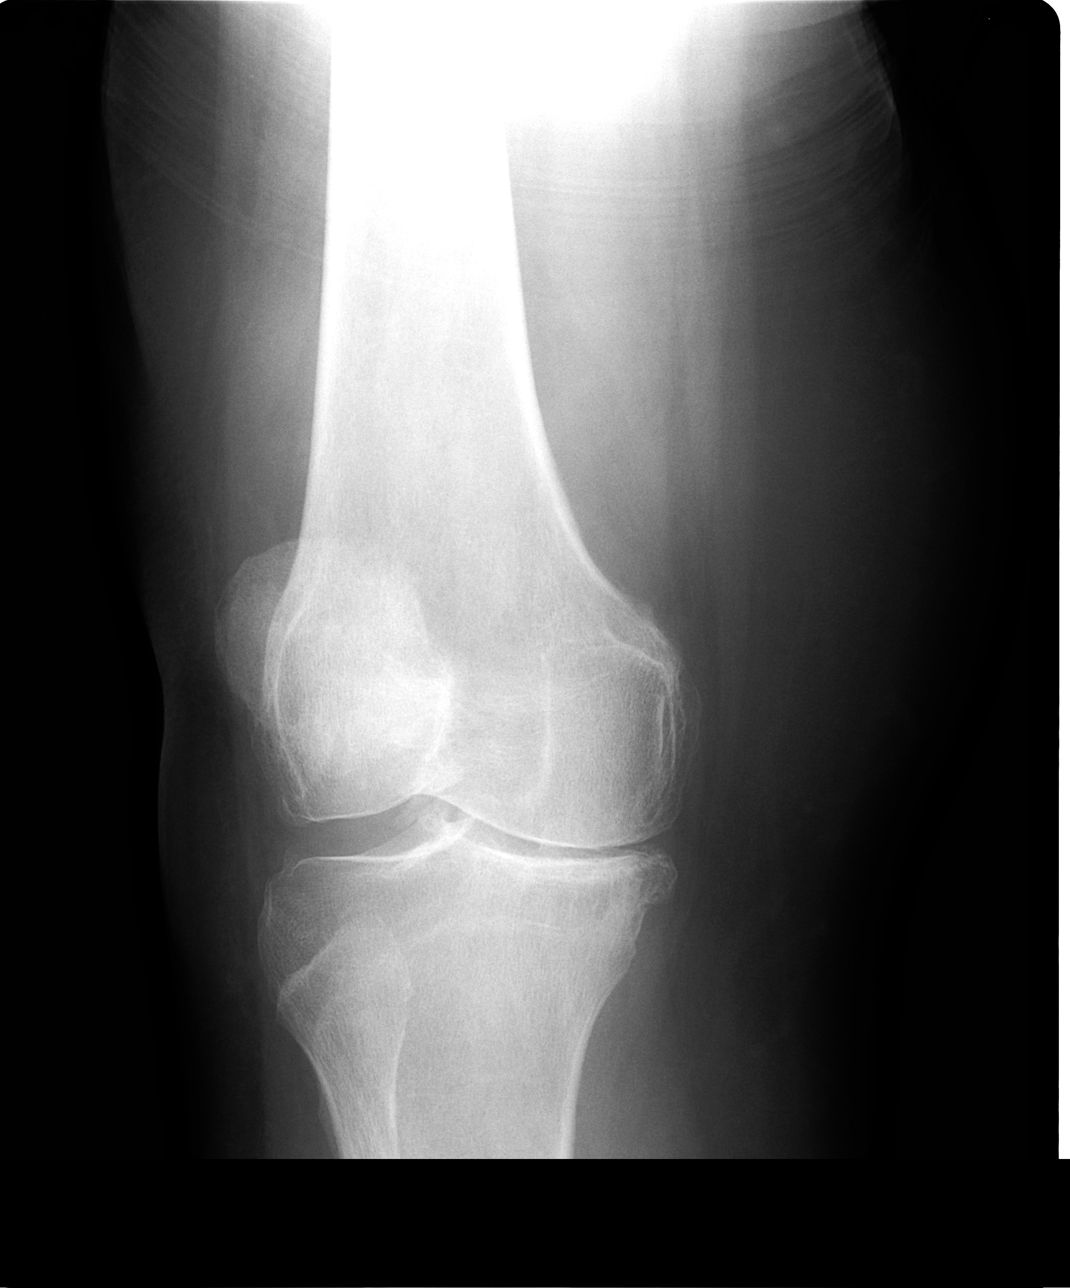

[view not recorded (4 of 4)]
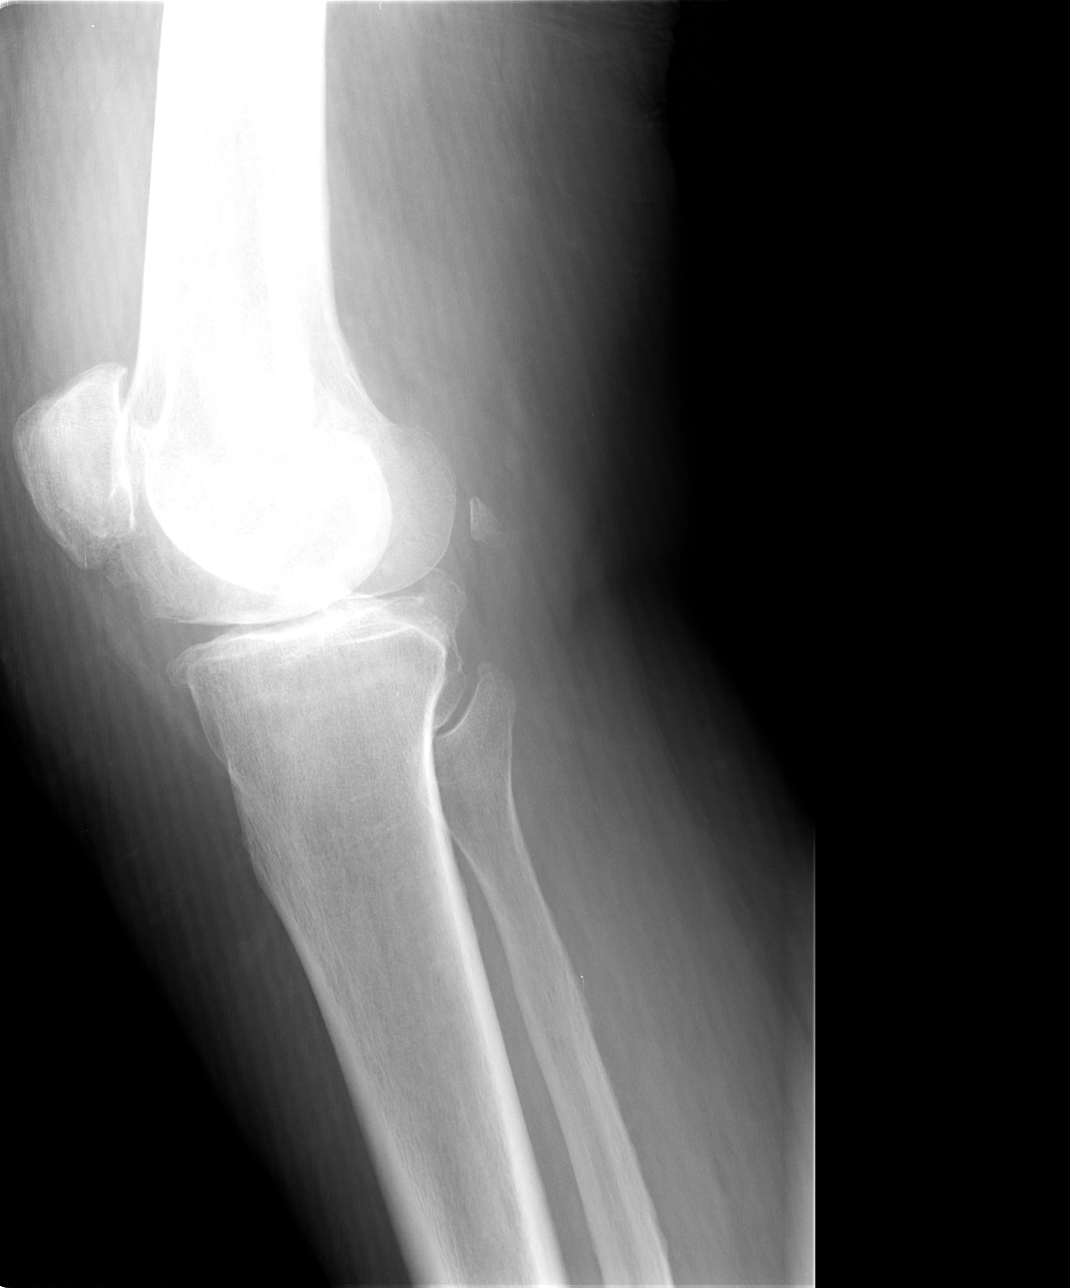

[4 of 4 positions shown; findings below may reference images not displayed]

FINDINGS: Moderate to large joint effusion.  Moderate medial
compartment joint space narrowing.  Severe medial and
patellofemoral compartment degenerative spurring. Bone
mineralization is within normal limits.  No acute fracture
dislocation.
IMPRESSION: 1.  Moderate-to-large joint effusion.
2.  Advanced medial and patellofemoral compartment degenerative
changes.

## 2011-07-06 DIAGNOSIS — H3560 Retinal hemorrhage, unspecified eye: Secondary | ICD-10-CM

## 2011-07-06 HISTORY — DX: Retinal hemorrhage, unspecified eye: H35.60

## 2011-08-06 ENCOUNTER — Ambulatory Visit (INDEPENDENT_AMBULATORY_CARE_PROVIDER_SITE_OTHER): Payer: Medicare Other | Admitting: Physician Assistant

## 2011-08-06 ENCOUNTER — Encounter: Payer: Self-pay | Admitting: Physician Assistant

## 2011-08-06 VITALS — BP 120/77 | HR 78 | Wt 206.0 lb

## 2011-08-06 DIAGNOSIS — J40 Bronchitis, not specified as acute or chronic: Secondary | ICD-10-CM

## 2011-08-06 MED ORDER — BENZONATATE 100 MG PO CAPS: 100.0000 mg | ORAL_CAPSULE | Freq: Three times a day (TID) | ORAL | Status: AC | PRN

## 2011-08-06 MED ORDER — HYDROCODONE-HOMATROPINE 5-1.5 MG/5ML PO SYRP: 2.5000 mL | ORAL_SOLUTION | Freq: Every evening | ORAL | Status: AC | PRN

## 2011-08-06 NOTE — Progress Notes (Signed)
  Subjective:    Patient ID: April Dickerson, female    DOB: Jul 10, 1940, 71 y.o.   MRN: [id number]  HPI Patient presents to the clinic with cough and sore throat that started last Thursday which was 4 days ago. She did run a fever of around 99.1 on Wednesday to stop days ago but has not ran a fever since. She denies any shortness of breath, wheezing, sinus pressure, ear pain or headache. She has started to feel better since yesterday. She's used NyQuil for cough at night and it has helped. She had clindamycin at home and she took it for 3 days. She has had a cough that is worse at night. She is not a history of pulmonary problems or asthma.      Review of Systems     Objective:   Physical Exam  Constitutional: She is oriented to person, place, and time. She appears well-developed and well-nourished.  HENT:  Head: Normocephalic and atraumatic.  Right Ear: External ear normal.  Left Ear: External ear normal.  Nose: Nose normal.  Mouth/Throat: Oropharynx is clear and moist.       TMs normal bilaterally.negative for maxillary tenderness bilaterally.  Eyes: Conjunctivae are normal.  Neck: Normal range of motion. Neck supple.  Cardiovascular: Normal rate, regular rhythm and normal heart sounds.   Pulmonary/Chest: Effort normal and breath sounds normal. She has no wheezes.  Lymphadenopathy:    She has no cervical adenopathy.  Neurological: She is alert and oriented to person, place, and time.  Skin: Skin is warm and dry.  Psychiatric: She has a normal mood and affect. Her behavior is normal.          Assessment & Plan:  Bronchitis-I reassured the patient that most episodes of bronchitis or viral. I discussed with her wanted her to try symptomatic care since she has started for better with Vitamin C and Zinc have been proven to limit duration of illness. Hycodan was given for cough. Stay hydrated. Can consider Mucinex-D if very congested. If not improving in the next 3 days or  getting worse or spikes another temperature  will check a chest x-ray.  Patient declined colonoscopy and health maintenance vaccines today.

## 2011-08-06 NOTE — Patient Instructions (Addendum)
Vitamin C and Zinc have been proven to limit duration of illness. Hycodan was given for cough. Stay hydrated. Can consider Mucinex-D if very congested. If not improving in the next 3 days or getting worse call office and we will get a chest x-ray.  Bronchitis Bronchitis is the body's way of reacting to injury and/or infection (inflammation) of the bronchi. Bronchi are the air tubes that extend from the windpipe into the lungs. If the inflammation becomes severe, it may cause shortness of breath. CAUSES  Inflammation may be caused by:  A virus.   Germs (bacteria).   Dust.   Allergens.   Pollutants and many other irritants.  The cells lining the bronchial tree are covered with tiny hairs (cilia). These constantly beat upward, away from the lungs, toward the mouth. This keeps the lungs free of pollutants. When these cells become too irritated and are unable to do their job, mucus begins to develop. This causes the characteristic cough of bronchitis. The cough clears the lungs when the cilia are unable to do their job. Without either of these protective mechanisms, the mucus would settle in the lungs. Then you would develop pneumonia. Smoking is a common cause of bronchitis and can contribute to pneumonia. Stopping this habit is the single most important thing you can do to help yourself. TREATMENT   Your caregiver may prescribe an antibiotic if the cough is caused by bacteria. Also, medicines that open up your airways make it easier to breathe. Your caregiver may also recommend or prescribe an expectorant. It will loosen the mucus to be coughed up. Only take over-the-counter or prescription medicines for pain, discomfort, or fever as directed by your caregiver.   Removing whatever causes the problem (smoking, for example) is critical to preventing the problem from getting worse.   Cough suppressants may be prescribed for relief of cough symptoms.   Inhaled medicines may be prescribed to help  with symptoms now and to help prevent problems from returning.   For those with recurrent (chronic) bronchitis, there may be a need for steroid medicines.  SEEK IMMEDIATE MEDICAL CARE IF:   During treatment, you develop more pus-like mucus (purulent sputum).   You have a fever.   Your baby is older than 3 months with a rectal temperature of 102 F (38.9 C) or higher.   Your baby is 57 months old or younger with a rectal temperature of 100.4 F (38 C) or higher.   You become progressively more ill.   You have increased difficulty breathing, wheezing, or shortness of breath.  It is necessary to seek immediate medical care if you are elderly or sick from any other disease. MAKE SURE YOU:   Understand these instructions.   Will watch your condition.   Will get help right away if you are not doing well or get worse.  Document Released: 04/23/2005 Document Revised: 04/12/2011 Document Reviewed: 03/02/2008 [facility] Patient Information 2012 [place], [place].

## 2011-08-07 ENCOUNTER — Ambulatory Visit: Payer: Medicare Other | Admitting: Family Medicine

## 2011-12-25 ENCOUNTER — Encounter: Payer: Self-pay | Admitting: Family Medicine

## 2011-12-25 ENCOUNTER — Telehealth: Payer: Self-pay | Admitting: Family Medicine

## 2011-12-25 ENCOUNTER — Ambulatory Visit (INDEPENDENT_AMBULATORY_CARE_PROVIDER_SITE_OTHER): Payer: Medicare Other | Admitting: Family Medicine

## 2011-12-25 ENCOUNTER — Ambulatory Visit (INDEPENDENT_AMBULATORY_CARE_PROVIDER_SITE_OTHER): Payer: Medicare Other | Admitting: Sports Medicine

## 2011-12-25 VITALS — BP 146/78 | HR 77 | Wt 206.0 lb

## 2011-12-25 DIAGNOSIS — M1711 Unilateral primary osteoarthritis, right knee: Secondary | ICD-10-CM | POA: Insufficient documentation

## 2011-12-25 DIAGNOSIS — I10 Essential (primary) hypertension: Secondary | ICD-10-CM

## 2011-12-25 DIAGNOSIS — M6530 Trigger finger, unspecified finger: Secondary | ICD-10-CM

## 2011-12-25 DIAGNOSIS — E039 Hypothyroidism, unspecified: Secondary | ICD-10-CM

## 2011-12-25 DIAGNOSIS — M25569 Pain in unspecified knee: Secondary | ICD-10-CM

## 2011-12-25 DIAGNOSIS — M65331 Trigger finger, right middle finger: Secondary | ICD-10-CM | POA: Insufficient documentation

## 2011-12-25 MED ORDER — LISINOPRIL-HYDROCHLOROTHIAZIDE 10-12.5 MG PO TABS: 1.0000 | ORAL_TABLET | Freq: Every day | ORAL | Status: DC

## 2011-12-25 MED ORDER — SYNTHROID 88 MCG PO TABS: 88.0000 ug | ORAL_TABLET | Freq: Every day | ORAL | Status: DC

## 2011-12-25 NOTE — Assessment & Plan Note (Signed)
It sounds as though she is a known history of knee DJD. She is already had Synvisc one, 3 sets. His work fairly well. I've advised her that when she is ready, she should just give [place] a call, and we can set her up for Visco supplementation

## 2011-12-25 NOTE — Progress Notes (Signed)
Patient ID: April Dickerson, female   DOB: [birth year], 71 y.o.   MRN: [id number] Subjective:    CC: right hand pain  HPI: April Dickerson is a very pleasant 71 year old female who comes in with a known history of trigger finger involving her right third digit. She had an injection approximately 9-10 months ago which lasted for as long. Currently she is having recurrence of pain that she localizes at the metacarpophalangeal joint of her third digit in her right hand, she notes locking and triggering. She also has some fullness to the flexor tendon sheath. She's desiring an additional injection.  Past medical history, Surgical history, Family history, Social history, Allergies, and medications have been entered into the medical record, reviewed, and no changes needed.   Review of Systems: No fevers, chills, night sweats, weight loss, chest pain, or shortness of breath.   Objective:    General: Well Developed, well nourished, and in no acute distress.  Neuro: Alert and oriented x3, extra-ocular muscles intact.  HEENT: Normocephalic, atraumatic, pupils equal round reactive to light, neck supple, no masses, no lymphadenopathy, thyroid nonpalpable.  Skin: Warm and dry, no rashes. Cardiac: Regular rate and rhythm, no murmurs rubs or gallops.  Respiratory: Clear to auscultation bilaterally. Not using accessory muscles, speaking in full sentences. Right hand shows prominence of the flexor digitorum profundus and superficialis tendon sheath. There is a palpable nodule just proximal to the A1 pulley. She maintains good strength, and sensation distally.  Procedure: Real-time Ultrasound Guided Injection of right third flexor tendon sheath proximal to the A1 pulley. Device: GE Logiq E  Ultrasound guided injection is preferred based studies that show increased duration, increased effect, greater accuracy, decreased procedural pain, increased response rate, and decreased cost with ultrasound guided versus blind  injection.  Verbal informed consent obtained.  Time-out conducted.  Noted no overlying erythema, induration, or other signs of local infection.  Skin prepped in a sterile fashion.  Local anesthesia: Topical Ethyl chloride.  With sterile technique and under real time ultrasound guidance:  Needle advanced in a short axis with a probe into what appeared to be a sheath effusion. 1/2 cc Kenalog 40, 1 cc lidocaine injected with a 25-gauge needle into the sheath. Completed without difficulty  Pain immediately resolved suggesting accurate placement of the medication.  Advised to call if fevers/chills, erythema, induration, drainage, or persistent bleeding.  Images permanently stored and available for review in the ultrasound unit.  Impression: Technically successful ultrasound guided injection.  Impression and Recommendations:

## 2011-12-25 NOTE — Progress Notes (Signed)
  Subjective:    Patient ID: April Dickerson, female    DOB: 04/01/1941, 71 y.o.   MRN: [id number]  HPI  Hypothyroid - Off her med for 4 weeks.  Says was having some financial difficulty and couldn't afford her med.  She says things are straightened out adn would like to get refills. Has gained a few pounds but feels it is from stress eating rather than her thyoid.   Went for eye check up in March and had fluid on the macula.  Got shots in the eye once a month..    Trigger finger - had injection last Nov by Dr. Corinna Capra . Did help but havng pain again. They had discussed surgery if didn't get much better. She asks that I inject her tendon today.    HTN- no CP or SOB. Taking meds regularly.     Review of Systems     Objective:   Physical Exam  Constitutional: She is oriented to person, place, and time. She appears well-developed and well-nourished.  HENT:  Head: Normocephalic and atraumatic.  Neck: Neck supple. No thyromegaly present.  Cardiovascular: Normal rate, regular rhythm and normal heart sounds.   Pulmonary/Chest: Effort normal and breath sounds normal.  Lymphadenopathy:    She has no cervical adenopathy.  Neurological: She is alert and oriented to person, place, and time.  Skin: Skin is warm and dry.  Psychiatric: She has a normal mood and affect. Her behavior is normal.          Assessment & Plan:  HTN- not well controlled. I would like to add lisinopril to her regimen. Follow up in 6 weeks for blood pressure. She can call if she's has any problems with the medication.  Hypothyroid - Need to restart her medication. After she has been on it for 2-4 weeks and she can get the lab to have her TSH rechecked. Lab slip given today.   Trigger finger - explained that I don't do these injections.  Can either refer back to Dr. Corinna Capra or Refer to Dr. Benjamin Stain for injection.   Encouraged her to schedule an annual wellness exam.

## 2011-12-25 NOTE — Telephone Encounter (Signed)
Please call [facility] and have them fax me last couple of notes.

## 2011-12-25 NOTE — Assessment & Plan Note (Signed)
Ultrasound guided injection as above. I would like her to do some rehabilitation exercises which include stress ball squeezes, stretching, and topical massage with ice. I would like to see her back in 4 weeks to see how she is doing.

## 2011-12-25 NOTE — Patient Instructions (Addendum)
1.5 Gram Low Sodium Diet A 1.5 gram sodium diet restricts the amount of sodium in the diet to no more than 1.5 g or 1500 mg daily. The American Heart Association recommends Americans over the age of 20 to consume no more than 1500 mg of sodium each day to reduce the risk of developing high blood pressure. Research also shows that limiting sodium may reduce heart attack and stroke risk. Many foods contain sodium for flavor and sometimes as a preservative. When the amount of sodium in a diet needs to be low, it is important to know what to look for when choosing foods and drinks. The following includes some information and guidelines to help make it easier for you to adapt to a low sodium diet. QUICK TIPS  Do not add salt to food.   Avoid convenience items and fast food.   Choose unsalted snack foods.   Buy lower sodium products, often labeled as "lower sodium" or "no salt added."   Check food labels to learn how much sodium is in 1 serving.   When eating at a restaurant, ask that your food be prepared with less salt or none, if possible.  READING FOOD LABELS FOR SODIUM INFORMATION The nutrition facts label is a good place to find how much sodium is in foods. Look for products with no more than 400 mg of sodium per serving. Remember that 1.5 g = 1500 mg. The food label may also list foods as:  Sodium-free: Less than 5 mg in a serving.   Very low sodium: 35 mg or less in a serving.   Low-sodium: 140 mg or less in a serving.   Light in sodium: 50% less sodium in a serving. For example, if a food that usually has 300 mg of sodium is changed to become light in sodium, it will have 150 mg of sodium.   Reduced sodium: 25% less sodium in a serving. For example, if a food that usually has 400 mg of sodium is changed to reduced sodium, it will have 300 mg of sodium.  CHOOSING FOODS Grains  Avoid: Salted crackers and snack items. Some cereals, including instant hot cereals. Bread stuffing and  biscuit mixes. Seasoned rice or pasta mixes.   Choose: Unsalted snack items. Low-sodium cereals, oats, puffed wheat and rice, shredded wheat. English muffins and bread. Pasta.  Meats  Avoid: Salted, canned, smoked, spiced, pickled meats, including fish and poultry. Bacon, ham, sausage, cold cuts, hot dogs, anchovies.   Choose: Low-sodium canned tuna and salmon. Fresh or frozen meat, poultry, and fish.  Dairy  Avoid: Processed cheese and spreads. Cottage cheese. Buttermilk and condensed milk. Regular cheese.   Choose: Milk. Low-sodium cottage cheese. Yogurt. Sour cream. Low-sodium cheese.  Fruits and Vegetables  Avoid: Regular canned vegetables. Regular canned tomato sauce and paste. Frozen vegetables in sauces. Olives. Pickles. Relishes. Sauerkraut.   Choose: Low-sodium canned vegetables. Low-sodium tomato sauce and paste. Frozen or fresh vegetables. Fresh and frozen fruit.  Condiments  Avoid: Canned and packaged gravies. Worcestershire sauce. Tartar sauce. Barbecue sauce. Soy sauce. Steak sauce. Ketchup. Onion, garlic, and table salt. Meat flavorings and tenderizers.   Choose: Fresh and dried herbs and spices. Low-sodium varieties of mustard and ketchup. Lemon juice. Tabasco sauce. Horseradish.  SAMPLE 1.5 GRAM SODIUM MEAL PLAN Breakfast / Sodium (mg)  1 cup low-fat milk / 143 mg   1 whole-wheat English muffin / 240 mg   1 tbs heart-healthy margarine / 153 mg   1 hard-boiled egg /   139 mg   1 small orange / 0 mg  Lunch / Sodium (mg)  1 cup raw carrots / 76 mg   2 tbs no salt added peanut butter / 5 mg   2 slices whole-wheat bread / 270 mg   1 tbs jelly / 6 mg    cup red grapes / 2 mg  Dinner / Sodium (mg)  1 cup whole-wheat pasta / 2 mg   1 cup low-sodium tomato sauce / 73 mg   3 oz lean ground beef / 57 mg   1 small side salad (1 cup raw spinach leaves,  cup cucumber,  cup yellow bell pepper) with 1 tsp olive oil and 1 tsp red wine vinegar / 25 mg  Snack /  Sodium (mg)  1 container low-fat vanilla yogurt / 107 mg   3 graham cracker squares / 127 mg  Nutrient Analysis  Calories: 1745   Protein: 75 g   Carbohydrate: 237 g   Fat: 57 g   Sodium: 1425 mg  Document Released: 04/23/2005 Document Revised: 04/12/2011 Document Reviewed: 07/25/2009 [employer] Patient Information 2012 [employer].DASH Diet The DASH diet stands for "Dietary Approaches to Stop Hypertension." It is a healthy eating plan that has been shown to reduce high blood pressure (hypertension) in as little as 14 days, while also possibly providing other significant health benefits. These other health benefits include reducing the risk of breast cancer after menopause and reducing the risk of type 2 diabetes, heart disease, colon cancer, and stroke. Health benefits also include weight loss and slowing kidney failure in patients with chronic kidney disease.   DIET GUIDELINES  Limit salt (sodium). Your diet should contain less than 1500 mg of sodium daily.   Limit refined or processed carbohydrates. Your diet should include mostly whole grains. Desserts and added sugars should be used sparingly.   Include small amounts of heart-healthy fats. These types of fats include nuts, oils, and tub margarine. Limit saturated and trans fats. These fats have been shown to be harmful in the body.  CHOOSING FOODS   The following food groups are based on a 2000 calorie diet. See your Registered Dietitian for individual calorie needs. Grains and Grain Products (6 to 8 servings daily)  Eat More Often: Whole-wheat bread, brown rice, whole-grain or wheat pasta, quinoa, popcorn without added fat or salt (air popped).   Eat Less Often: White bread, white pasta, white rice, cornbread.  Vegetables (4 to 5 servings daily)  Eat More Often: Fresh, frozen, and canned vegetables. Vegetables may be raw, steamed, roasted, or grilled with a minimal amount of fat.   Eat Less Often/Avoid: Creamed or fried  vegetables. Vegetables in a cheese sauce.  Fruit (4 to 5 servings daily)  Eat More Often: All fresh, canned (in natural juice), or frozen fruits. Dried fruits without added sugar. One hundred percent fruit juice ( cup [237 mL] daily).   Eat Less Often: Dried fruits with added sugar. Canned fruit in light or heavy syrup.  Lean Meats, Fish, and Poultry (2 servings or less daily. One serving is 3 to 4 oz [85-114 g]).  Eat More Often: Ninety percent or leaner ground beef, tenderloin, sirloin. Round cuts of beef, chicken breast, turkey breast. All fish. Grill, bake, or broil your meat. Nothing should be fried.   Eat Less Often/Avoid: Fatty cuts of meat, turkey, or chicken leg, thigh, or wing. Fried cuts of meat or fish.  Dairy (2 to 3 servings)  Eat More Often: Low-fat   or fat-free milk, low-fat plain or light yogurt, reduced-fat or part-skim cheese.   Eat Less Often/Avoid: Milk (whole, 2%, skim, or chocolate). Whole milk yogurt. Full-fat cheeses.  Nuts, Seeds, and Legumes (4 to 5 servings per week)  Eat More Often: All without added salt.   Eat Less Often/Avoid: Salted nuts and seeds, canned beans with added salt.  Fats and Sweets (limited)  Eat More Often: Vegetable oils, tub margarines without trans fats, sugar-free gelatin. Mayonnaise and salad dressings.   Eat Less Often/Avoid: Coconut oils, palm oils, butter, stick margarine, cream, half and half, cookies, candy, pie.  FOR MORE INFORMATION The Dash Diet Eating Plan: www.dashdiet.org Document Released: 04/12/2011 Document Reviewed: 04/02/2011 [employer] Patient Information 2012 [employer]. 

## 2011-12-26 NOTE — Telephone Encounter (Signed)
Requested records and they will fax

## 2011-12-28 ENCOUNTER — Other Ambulatory Visit: Payer: Self-pay

## 2011-12-28 ENCOUNTER — Encounter: Payer: Self-pay | Admitting: Family Medicine

## 2011-12-28 DIAGNOSIS — H3530 Unspecified macular degeneration: Secondary | ICD-10-CM | POA: Insufficient documentation

## 2011-12-28 DIAGNOSIS — M79643 Pain in unspecified hand: Secondary | ICD-10-CM

## 2011-12-28 MED ORDER — ACETAMINOPHEN-CODEINE 300-30 MG PO TABS: 2.0000 | ORAL_TABLET | Freq: Four times a day (QID) | ORAL | Status: DC | PRN

## 2011-12-28 NOTE — Telephone Encounter (Signed)
Prescription faxed

## 2011-12-28 NOTE — Telephone Encounter (Signed)
She needs a refill for her pain medication for her hand pain.

## 2011-12-28 NOTE — Telephone Encounter (Signed)
Refilled, and placed in fax box.

## 2012-01-31 LAB — COMPLETE METABOLIC PANEL WITH GFR
AST: 15 U/L (ref 0–37)
Albumin: 4.1 g/dL (ref 3.5–5.2)
BUN: 11 mg/dL (ref 6–23)
Calcium: 9.3 mg/dL (ref 8.4–10.5)
Chloride: 108 mEq/L (ref 96–112)
Creat: 0.68 mg/dL (ref 0.50–1.10)
GFR, Est Non African American: 88 mL/min
Glucose, Bld: 81 mg/dL (ref 70–99)

## 2012-01-31 LAB — LIPID PANEL
Cholesterol: 164 mg/dL (ref 0–200)
Triglycerides: 123 mg/dL (ref <150)
VLDL: 25 mg/dL (ref 0–40)

## 2012-06-06 ENCOUNTER — Encounter: Payer: Self-pay | Admitting: Physician Assistant

## 2012-06-06 ENCOUNTER — Ambulatory Visit (INDEPENDENT_AMBULATORY_CARE_PROVIDER_SITE_OTHER): Payer: Medicare Other | Admitting: Physician Assistant

## 2012-06-06 VITALS — BP 126/78 | HR 62 | Wt 213.0 lb

## 2012-06-06 DIAGNOSIS — M79609 Pain in unspecified limb: Secondary | ICD-10-CM

## 2012-06-06 DIAGNOSIS — I10 Essential (primary) hypertension: Secondary | ICD-10-CM

## 2012-06-06 DIAGNOSIS — M79643 Pain in unspecified hand: Secondary | ICD-10-CM

## 2012-06-06 MED ORDER — ACETAMINOPHEN-CODEINE 300-30 MG PO TABS: 2.0000 | ORAL_TABLET | Freq: Four times a day (QID) | ORAL | Status: DC | PRN

## 2012-06-06 NOTE — Patient Instructions (Addendum)
Discussed continuing with low salt diet and regular exercise.   1.5 Gram Low Sodium Diet A 1.5 gram sodium diet restricts the amount of sodium in the diet to no more than 1.5 g or 1500 mg daily. The American Heart Association recommends Americans over the age of 28 to consume no more than 1500 mg of sodium each day to reduce the risk of developing high blood pressure. Research also shows that limiting sodium may reduce heart attack and stroke risk. Many foods contain sodium for flavor and sometimes as a preservative. When the amount of sodium in a diet needs to be low, it is important to know what to look for when choosing foods and drinks. The following includes some information and guidelines to help make it easier for you to adapt to a low sodium diet. QUICK TIPS  Do not add salt to food.  Avoid convenience items and fast food.  Choose unsalted snack foods.  Buy lower sodium products, often labeled as "lower sodium" or "no salt added."  Check food labels to learn how much sodium is in 1 serving.  When eating at a restaurant, ask that your food be prepared with less salt or none, if possible. READING FOOD LABELS FOR SODIUM INFORMATION The nutrition facts label is a good place to find how much sodium is in foods. Look for products with no more than 400 mg of sodium per serving. Remember that 1.5 g = 1500 mg. The food label may also list foods as:  Sodium-free: Less than 5 mg in a serving.  Very low sodium: 35 mg or less in a serving.  Low-sodium: 140 mg or less in a serving.  Light in sodium: 50% less sodium in a serving. For example, if a food that usually has 300 mg of sodium is changed to become light in sodium, it will have 150 mg of sodium.  Reduced sodium: 25% less sodium in a serving. For example, if a food that usually has 400 mg of sodium is changed to reduced sodium, it will have 300 mg of sodium. CHOOSING FOODS Grains  Avoid: Salted crackers and snack items. Some  cereals, including instant hot cereals. Bread stuffing and biscuit mixes. Seasoned rice or pasta mixes.  Choose: Unsalted snack items. Low-sodium cereals, oats, puffed wheat and rice, shredded wheat. English muffins and bread. Pasta. Meats  Avoid: Salted, canned, smoked, spiced, pickled meats, including fish and poultry. Bacon, ham, sausage, cold cuts, hot dogs, anchovies.  Choose: Low-sodium canned tuna and salmon. Fresh or frozen meat, poultry, and fish. Dairy  Avoid: Processed cheese and spreads. Cottage cheese. Buttermilk and condensed milk. Regular cheese.  Choose: Milk. Low-sodium cottage cheese. Yogurt. Sour cream. Low-sodium cheese. Fruits and Vegetables  Avoid: Regular canned vegetables. Regular canned tomato sauce and paste. Frozen vegetables in sauces. Olives. Rosita Fire. Relishes. Sauerkraut.  Choose: Low-sodium canned vegetables. Low-sodium tomato sauce and paste. Frozen or fresh vegetables. Fresh and frozen fruit. Condiments  Avoid: Canned and packaged gravies. Worcestershire sauce. Tartar sauce. Barbecue sauce. Soy sauce. Steak sauce. Ketchup. Onion, garlic, and table salt. Meat flavorings and tenderizers.  Choose: Fresh and dried herbs and spices. Low-sodium varieties of mustard and ketchup. Lemon juice. Tabasco sauce. Horseradish. SAMPLE 1.5 GRAM SODIUM MEAL PLAN Breakfast / Sodium (mg)  1 cup low-fat milk / 143 mg  1 whole-wheat English muffin / 240 mg  1 tbs heart-healthy margarine / 153 mg  1 hard-boiled egg / 139 mg  1 small orange / 0 mg Lunch / Sodium (  mg)  1 cup raw carrots / 76 mg  2 tbs no salt added peanut butter / 5 mg  2 slices whole-wheat bread / 270 mg  1 tbs jelly / 6 mg   cup red grapes / 2 mg Dinner / Sodium (mg)  1 cup whole-wheat pasta / 2 mg  1 cup low-sodium tomato sauce / 73 mg  3 oz lean ground beef / 57 mg  1 small side salad (1 cup raw spinach leaves,  cup cucumber,  cup yellow bell pepper) with 1 tsp olive oil and 1 tsp  red wine vinegar / 25 mg Snack / Sodium (mg)  1 container low-fat vanilla yogurt / 107 mg  3 graham cracker squares / 127 mg Nutrient Analysis  Calories: 1745  Protein: 75 g  Carbohydrate: 237 g  Fat: 57 g  Sodium: 1425 mg Document Released: 04/23/2005 Document Revised: 07/16/2011 Document Reviewed: 07/25/2009 [facility] Patient Information 2013 [place], [place].

## 2012-06-06 NOTE — Progress Notes (Signed)
  Subjective:    Patient ID: April Dickerson, female    DOB: 09/17/1940, 72 y.o.   MRN: [id number]  HPI Patient is a very pleasant 72 year old female who presents to clinic to followup on hypertension. She was last seen by Dr. Glade Lloyd in August 2013. Dr. Glade Lloyd added lisinopril/HCTZ 2 blood pressure therapy in hopes to control BP under 140/90. Patient has not started blood pressure medication and wanted to do it all on her own. Blood pressure is controlled today at 126/78. She has made diet changes and decreasing salt and walks regularly. She does not want to go on any blood pressure medication. She did fall in November 2013 and blood pressure was in the 180s over 90s. I did concern her but has not had time to get in until today. Patient denies any chest pain, palpitations, shortness of breath, headaches, vision changes, or numbness and tingling down arms. She does notice an increase in blood pressure when she is in pain or stressed.  Patient does have occasional right hand pain in the same hand that she has recurrent trigger finger. She has run out of her acetaminophen/codeine. She would like a refill today.   Review of Systems     Objective:   Physical Exam  Constitutional: She is oriented to person, place, and time. She appears well-developed and well-nourished.  HENT:  Head: Normocephalic and atraumatic.  Cardiovascular: Normal rate, regular rhythm and normal heart sounds.   Pulmonary/Chest: Effort normal and breath sounds normal.  Neurological: She is alert and oriented to person, place, and time.  Skin: Skin is warm and dry.       Negative for any lower extremity edema.  Psychiatric: She has a normal mood and affect. Her behavior is normal.          Assessment & Plan:  Hypertension, benign-patient is currently controlled without blood pressure medication. Further encouraged patient to keep a low salt diet and regular exercise. Patient could still lose 10-20 pounds to get with  an normal BMI range. Instructed patient that this would further decrease her blood pressure if she can get to this goal. Followup in the next 6 months to make sure blood pressure is staying under control. She would also need a thyroid recheck at that time.  Right hand pain-refilled acetaminophen/codeine to use as needed.

## 2012-07-21 ENCOUNTER — Ambulatory Visit: Payer: Medicare Other | Admitting: Sports Medicine

## 2012-07-22 ENCOUNTER — Encounter: Payer: Self-pay | Admitting: Sports Medicine

## 2012-07-22 ENCOUNTER — Ambulatory Visit (INDEPENDENT_AMBULATORY_CARE_PROVIDER_SITE_OTHER): Payer: Medicare Other | Admitting: Sports Medicine

## 2012-07-22 VITALS — BP 139/73 | HR 65 | Wt 214.0 lb

## 2012-07-22 DIAGNOSIS — M6530 Trigger finger, unspecified finger: Secondary | ICD-10-CM

## 2012-07-22 DIAGNOSIS — IMO0002 Reserved for concepts with insufficient information to code with codable children: Secondary | ICD-10-CM

## 2012-07-22 DIAGNOSIS — M1711 Unilateral primary osteoarthritis, right knee: Secondary | ICD-10-CM

## 2012-07-22 DIAGNOSIS — M1710 Unilateral primary osteoarthritis, unspecified knee: Secondary | ICD-10-CM

## 2012-07-22 DIAGNOSIS — M65331 Trigger finger, right middle finger: Secondary | ICD-10-CM

## 2012-07-22 NOTE — Assessment & Plan Note (Signed)
Patient will look into cost of supartz with medicare and get back with [place].

## 2012-07-22 NOTE — Progress Notes (Signed)
  Subjective:    CC: Followup right knee pain  HPI:  Right knee osteoarthritis: Is starting to hurt again, did have Synvisc one years ago, wondering if we can do this again. She does know that we carry Supartz, and before we do the procedure she would want to know how much she has to pay. She does desire to look into this before we start. Pain is localized at the joint line, worse with weightbearing, mild swelling, no mechanical symptoms.   Trigger finger injection performed months ago, completely resolved.  Past medical history, Surgical history, Family history not pertinant except as noted below, Social history, Allergies, and medications have been entered into the medical record, reviewed, and no changes needed.   Review of Systems: No headache, visual changes, nausea, vomiting, diarrhea, constipation, dizziness, abdominal pain, skin rash, fevers, chills, night sweats, weight loss, swollen lymph nodes, body aches, joint swelling, muscle aches, chest pain, shortness of breath, mood changes, visual or auditory hallucinations.   Objective:   General: Well Developed, well nourished, and in no acute distress.  Neuro/Psych: Alert and oriented x3, extra-ocular muscles intact, able to move all 4 extremities, sensation grossly intact. Skin: Warm and dry, no rashes noted.  Respiratory: Not using accessory muscles, speaking in full sentences, trachea midline.  Cardiovascular: Pulses palpable, no extremity edema. Abdomen: Does not appear distended. Right Knee: Normal to inspection with no erythema or effusion or obvious bony abnormalities. Medial joint line pain. ROM full in flexion and extension and lower leg rotation. Ligaments with solid consistent endpoints including ACL, PCL, LCL, MCL. Negative Mcmurray's, Apley's, and Thessalonian tests. Non painful patellar compression. Patellar glide without crepitus. Patellar and quadriceps tendons unremarkable. Hamstring and quadriceps strength is  normal.  Impression and Recommendations:   This case required medical decision making of moderate complexity.

## 2012-07-22 NOTE — Assessment & Plan Note (Signed)
Completely resolved after trigger finger injection.

## 2012-07-23 ENCOUNTER — Telehealth: Payer: Self-pay | Admitting: *Deleted

## 2012-07-23 NOTE — Telephone Encounter (Signed)
Pt informed

## 2012-07-23 NOTE — Telephone Encounter (Signed)
Message copied by Renea Ee on Wed Jul 23, 2012  3:22 PM ------      Message from: Monica Becton      Created: Tue Jul 22, 2012  6:08 PM       I touched base with Molli Hazard regarding this, it looks like for Medicare alone total out of pocket cost will be around $60 per visit. Please let patient know if she can make her own decision whether she wants to proceed with this.      -T ------

## 2012-07-30 ENCOUNTER — Other Ambulatory Visit: Payer: Self-pay | Admitting: Family Medicine

## 2012-09-10 ENCOUNTER — Other Ambulatory Visit: Payer: Self-pay | Admitting: Physician Assistant

## 2012-12-31 ENCOUNTER — Encounter: Payer: Self-pay | Admitting: Physician Assistant

## 2012-12-31 ENCOUNTER — Ambulatory Visit (INDEPENDENT_AMBULATORY_CARE_PROVIDER_SITE_OTHER): Payer: Medicare Other | Admitting: Physician Assistant

## 2012-12-31 VITALS — BP 129/66 | HR 69 | Wt 216.0 lb

## 2012-12-31 DIAGNOSIS — E039 Hypothyroidism, unspecified: Secondary | ICD-10-CM

## 2012-12-31 DIAGNOSIS — M79609 Pain in unspecified limb: Secondary | ICD-10-CM

## 2012-12-31 DIAGNOSIS — R200 Anesthesia of skin: Secondary | ICD-10-CM

## 2012-12-31 DIAGNOSIS — L821 Other seborrheic keratosis: Secondary | ICD-10-CM

## 2012-12-31 DIAGNOSIS — R209 Unspecified disturbances of skin sensation: Secondary | ICD-10-CM

## 2012-12-31 DIAGNOSIS — M79602 Pain in left arm: Secondary | ICD-10-CM

## 2012-12-31 MED ORDER — DIPHENOXYLATE-ATROPINE 2.5-0.025 MG PO TABS: 1.0000 | ORAL_TABLET | Freq: Four times a day (QID) | ORAL | Status: DC | PRN

## 2012-12-31 NOTE — Progress Notes (Signed)
  Subjective:    Patient ID: April Dickerson, female    DOB: 10-25-1940, 72 y.o.   MRN: [id number]  HPI Patient is a 72 yo female who presents to the clinic for med refill, to have a mole looked at and also to discuss left arm pain.  Hypothyroidism is ongoing and controlled for quite sometime. Denies any new symptoms. Takes medication regularly. Has been over a year since thyroid levels checked.   Pt has a mole on her right breast that seems to be new. It does not bother her or bleed. Does not seem to be changing in color. Size does seem to be growing.   Pt has also had a pulsing as well as tingling sensation in left elbow and forearm for the last 2 weeks. When it started it was off and on and on for a few seconds. It has since worsened and become more constant and lasting longer. She has done nothing to make better. Nothing seems to aggravate it or make worse. She denies any left shoulder pain. She denies any CP, palpitations, chest pressure. Pain happens anytime and noticed it more with rest. 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    Review of Systems     Objective:   Physical Exam  Constitutional: She is oriented to person, place, and time. She appears well-developed and well-nourished.  HENT:  Head: Normocephalic and atraumatic.  Cardiovascular: Normal rate, regular rhythm and normal heart sounds.   Pulmonary/Chest: Effort normal and breath sounds normal. She has no wheezes.  Musculoskeletal:  Left arm- Weakness as expected and ongoing 3/5. No tenderness with palpation over elbow or forearm. Dull and sharp sensation intact for bilateral arms. ROM normal and without pain.   Neurological: She is alert and oriented to person, place, and time.  Skin: Skin is warm and dry.  Psychiatric: She has a normal mood and affect. Her behavior is normal.          Assessment & Plan:  Hypothyroidism- Will recheck today. Refill meds as needed.   seborrheic  keratosis- discussed with patient benign nature and if not bothering no need to remove.   Left arm tingling- Unclear etiology. Discussed with patient could possible be some neuropathy and rapid firing of nerves vs tendonitis. Discussed taking NSAIDs for a couple of days, icing and resting. If worsening or not improving could do imaging or look at other options.

## 2012-12-31 NOTE — Patient Instructions (Addendum)
Tendinitis  Tendinitis is swelling and inflammation of the tendons. Tendons are band-like tissues that connect muscle to bone. Tendinitis commonly occurs in the:    Shoulders (rotator cuff).   Heels (Achilles tendon).   Elbows (triceps tendon).  CAUSES  Tendinitis is usually caused by overusing the tendon, muscles, and joints involved. When the tissue surrounding a tendon (synovium) becomes inflamed, it is called tenosynovitis. Tendinitis commonly develops in people whose jobs require repetitive motions.  SYMPTOMS   Pain.   Tenderness.   Mild swelling.  DIAGNOSIS  Tendinitis is usually diagnosed by physical exam. Your caregiver may also order X-rays or other imaging tests.  TREATMENT  Your caregiver may recommend certain medicines or exercises for your treatment.  HOME CARE INSTRUCTIONS    Use a sling or splint for as long as directed by your caregiver until the pain decreases.   Put ice on the injured area.   Put ice in a plastic bag.   Place a towel between your skin and the bag.   Leave the ice on for 15-20 minutes, 3-4 times a day.   Avoid using the limb while the tendon is painful. Perform gentle range of motion exercises only as directed by your caregiver. Stop exercises if pain or discomfort increase, unless directed otherwise by your caregiver.   Only take over-the-counter or prescription medicines for pain, discomfort, or fever as directed by your caregiver.  SEEK MEDICAL CARE IF:    Your pain and swelling increase.   You develop new, unexplained symptoms, especially increased numbness in the hands.  MAKE SURE YOU:    Understand these instructions.   Will watch your condition.   Will get help right away if you are not doing well or get worse.  Document Released: 04/20/2000 Document Revised: 07/16/2011 Document Reviewed: 07/10/2010  [employer] Patient Information 2014 [employer].

## 2013-01-23 ENCOUNTER — Other Ambulatory Visit: Payer: Self-pay | Admitting: Physician Assistant

## 2013-01-23 ENCOUNTER — Telehealth: Payer: Self-pay | Admitting: *Deleted

## 2013-01-23 MED ORDER — ACETAMINOPHEN-CODEINE 300-30 MG PO TABS: 1.0000 | ORAL_TABLET | Freq: Four times a day (QID) | ORAL | Status: DC | PRN

## 2013-01-23 NOTE — Telephone Encounter (Signed)
Pt notified med sent to pharmacy. Stillman Buenger, LPN  

## 2013-01-23 NOTE — Telephone Encounter (Signed)
Ok faxed previous medication used for HA's to pharmacy.

## 2013-01-23 NOTE — Telephone Encounter (Signed)
Pt calls and states she has had a migraine for 1 1/2 days with some dizziness and can not drive. Said she usually takes hydrocodone but is out of it and would like to have a refill sent to pharmacy.  Uses [facility] in [place].

## 2013-04-09 ENCOUNTER — Telehealth: Payer: Self-pay | Admitting: Family Medicine

## 2013-04-09 MED ORDER — SYNTHROID 88 MCG PO TABS: ORAL_TABLET | ORAL | Status: DC

## 2013-04-09 NOTE — Telephone Encounter (Signed)
rx sent.April Dickerson April Dickerson  

## 2013-04-09 NOTE — Telephone Encounter (Signed)
Pt called and wants refill on her Synthroid.  She was upset  that she was  told by pharmacy that we did not know her.

## 2013-04-09 NOTE — Telephone Encounter (Signed)
[facility] told Drenda Freeze to cancel rx.Loralee Pacas Lynetta Will resend to [facility].Loralee Pacas [place]

## 2013-04-09 NOTE — Addendum Note (Signed)
Addended by: Deno Etienne on: 04/09/2013 04:41 PM   Modules accepted: Orders

## 2013-04-10 ENCOUNTER — Ambulatory Visit (INDEPENDENT_AMBULATORY_CARE_PROVIDER_SITE_OTHER): Payer: Medicare Other | Admitting: Physician Assistant

## 2013-04-10 ENCOUNTER — Encounter: Payer: Self-pay | Admitting: Physician Assistant

## 2013-04-10 VITALS — BP 130/73 | HR 65 | Wt 216.0 lb

## 2013-04-10 DIAGNOSIS — M7632 Iliotibial band syndrome, left leg: Secondary | ICD-10-CM

## 2013-04-10 DIAGNOSIS — M629 Disorder of muscle, unspecified: Secondary | ICD-10-CM

## 2013-04-10 MED ORDER — HYDROCODONE-ACETAMINOPHEN 5-325 MG PO TABS: 1.0000 | ORAL_TABLET | Freq: Four times a day (QID) | ORAL | Status: AC | PRN

## 2013-04-10 MED ORDER — IBUPROFEN 800 MG PO TABS: 800.0000 mg | ORAL_TABLET | Freq: Three times a day (TID) | ORAL | Status: DC | PRN

## 2013-04-12 NOTE — Progress Notes (Signed)
   Subjective:    Patient ID: April Dickerson, female    DOB: 1940/06/12, 72 y.o.   MRN: [id number]  HPI Patient is a 72 yo female who presents to the clinic with outside left leg pain. Pain started after walking up steps and feeling like she was putting more weight on her lateral leg. She wears a drop foot brace and sometimes harder for her to control wear she weight bears. She woke up next morning with even more discomfot. Pain starts at her left lateral outside hip and into lateral knee. Worse with movement and palpation. No back pain or radiation past knee. Tried tylenol helps minimally. No knee popping.    Review of Systems     Objective:   Physical Exam  Constitutional: She appears well-developed and well-nourished.  Cardiovascular: Normal rate, regular rhythm and normal heart sounds.   Musculoskeletal:  ROm of left knee is normal. Pain to palpation over left lateral knee and at insertion of IT band. No joint tenderness to palpation. Negative mcMurrays. Negative straight leg. Strength is 5/5.           Assessment & Plan:  IT band syndrome- Gave stretches. Gave Ibuprofen and Norco for break through pain. Ice. Call if not improving.

## 2013-05-15 ENCOUNTER — Encounter: Payer: Self-pay | Admitting: Physician Assistant

## 2013-05-15 ENCOUNTER — Ambulatory Visit (INDEPENDENT_AMBULATORY_CARE_PROVIDER_SITE_OTHER): Payer: Medicare Other | Admitting: Physician Assistant

## 2013-05-15 VITALS — BP 130/56 | HR 60

## 2013-05-15 DIAGNOSIS — Z79899 Other long term (current) drug therapy: Secondary | ICD-10-CM

## 2013-05-15 DIAGNOSIS — IMO0002 Reserved for concepts with insufficient information to code with codable children: Secondary | ICD-10-CM

## 2013-05-15 DIAGNOSIS — M792 Neuralgia and neuritis, unspecified: Secondary | ICD-10-CM | POA: Insufficient documentation

## 2013-05-16 NOTE — Progress Notes (Signed)
   Subjective:    Patient ID: April Dickerson, female    DOB: 12-Oct-1940, 73 y.o.   MRN: [id number]  HPI Pt comes in today to discuss narcotic usage. She is concerned b/c neurologist will not give her any more pain rx because they state she broke the pain contract. They have provided her care for over 4 years for neuralgia. Pt was seen in my clinic with unrelated IT band syndrome and was given rx for acute usage. She was told to contact PCP for pain rx.    Review of Systems     Objective:   Physical Exam  Constitutional: She appears well-developed and well-nourished.  HENT:  Head: Normocephalic and atraumatic.  Cardiovascular: Normal rate, regular rhythm and normal heart sounds.           Assessment & Plan:  Medication management- Discussed with pt i was unaware of pain contract as she was. I was only treating acute problem and do not feel comfortable prescribing narcotics for ongoing problems. I will write a letter to give to neurologist. If he will not continue to manage care will refer to another neurologist.pt has no hx of drug abuse. I do believe pt was unaware of contract. Letter written in office today.    Spent 30 minutes with pt and greater than 50 percent of visit spent counseling pt regarding pain contracts and narcotic usage.

## 2013-06-22 ENCOUNTER — Encounter: Payer: Self-pay | Admitting: Physician Assistant

## 2013-06-22 DIAGNOSIS — F0781 Postconcussional syndrome: Secondary | ICD-10-CM | POA: Insufficient documentation

## 2013-06-22 DIAGNOSIS — R519 Headache, unspecified: Secondary | ICD-10-CM | POA: Insufficient documentation

## 2013-06-22 DIAGNOSIS — G8194 Hemiplegia, unspecified affecting left nondominant side: Secondary | ICD-10-CM | POA: Insufficient documentation

## 2013-06-22 DIAGNOSIS — R51 Headache: Secondary | ICD-10-CM | POA: Insufficient documentation

## 2013-06-22 DIAGNOSIS — G894 Chronic pain syndrome: Secondary | ICD-10-CM | POA: Insufficient documentation

## 2013-07-03 ENCOUNTER — Encounter: Payer: Self-pay | Admitting: Physician Assistant

## 2013-07-03 ENCOUNTER — Ambulatory Visit (INDEPENDENT_AMBULATORY_CARE_PROVIDER_SITE_OTHER): Payer: Medicare Other | Admitting: Physician Assistant

## 2013-07-03 VITALS — BP 123/65 | HR 66 | Wt 216.0 lb

## 2013-07-03 DIAGNOSIS — H6121 Impacted cerumen, right ear: Secondary | ICD-10-CM

## 2013-07-03 DIAGNOSIS — Z131 Encounter for screening for diabetes mellitus: Secondary | ICD-10-CM

## 2013-07-03 DIAGNOSIS — E039 Hypothyroidism, unspecified: Secondary | ICD-10-CM

## 2013-07-03 DIAGNOSIS — H9191 Unspecified hearing loss, right ear: Secondary | ICD-10-CM

## 2013-07-03 DIAGNOSIS — H6120 Impacted cerumen, unspecified ear: Secondary | ICD-10-CM

## 2013-07-03 DIAGNOSIS — H938X9 Other specified disorders of ear, unspecified ear: Secondary | ICD-10-CM

## 2013-07-03 DIAGNOSIS — Z13220 Encounter for screening for lipoid disorders: Secondary | ICD-10-CM

## 2013-07-03 DIAGNOSIS — H9190 Unspecified hearing loss, unspecified ear: Secondary | ICD-10-CM

## 2013-07-03 MED ORDER — CARBAMIDE PEROXIDE 6.5 % OT SOLN: 5.0000 [drp] | Freq: Two times a day (BID) | OTIC | Status: DC

## 2013-07-03 NOTE — Patient Instructions (Signed)
Debrox sent to pharmacy.

## 2013-07-05 NOTE — Progress Notes (Signed)
   Subjective:    Patient ID: April Dickerson, female    DOB: 08-17-1940, 73 y.o.   MRN: [id number]  HPI Pt presents to the clinic with 2 days of right ear pressure and hearing loss. Hx of cerumen impaction. Placed hydrogen peroxide in ear and not able to get wax out. Denies any sinus pressure, runny nose, fever, chills, nausea, vomiting.   Review of Systems     Objective:   Physical Exam  Constitutional: She appears well-developed and well-nourished.  HENT:  Head: Normocephalic and atraumatic.  Left Ear: External ear normal.  Nose: Nose normal.  Mouth/Throat: Oropharynx is clear and moist.  Right ear impacted with cerumen. After irrigation TM's clear bilaterally.   Eyes: Conjunctivae are normal. Right eye exhibits no discharge. Left eye exhibits no discharge.  Neck: Normal range of motion. Neck supple.  Cardiovascular: Normal rate, regular rhythm and normal heart sounds.   Pulmonary/Chest: Effort normal and breath sounds normal.  Lymphadenopathy:    She has no cervical adenopathy.  Psychiatric: She has a normal mood and affect. Her behavior is normal.          Assessment & Plan:  Right ear cerumen impaction/hearing loss- after irrigation hearing came back. Gave rx for debrox to use as needed. Follow up with any wax that causes hearing loss.    Pt needs labs for medication refill. Discussed need for CPE. Lab slip given.

## 2013-09-09 ENCOUNTER — Ambulatory Visit (INDEPENDENT_AMBULATORY_CARE_PROVIDER_SITE_OTHER): Payer: Medicare Other | Admitting: Family Medicine

## 2013-09-09 ENCOUNTER — Encounter: Payer: Self-pay | Admitting: Family Medicine

## 2013-09-09 VITALS — BP 130/70 | HR 68 | Wt 211.0 lb

## 2013-09-09 DIAGNOSIS — R42 Dizziness and giddiness: Secondary | ICD-10-CM

## 2013-09-09 DIAGNOSIS — M25539 Pain in unspecified wrist: Secondary | ICD-10-CM

## 2013-09-09 NOTE — Progress Notes (Signed)
CC: April Dickerson is a 73 y.o. female is here for Dizziness   Subjective: HPI:  Complains of right wrist pain that's been present for the past 5 days it began a few hours after she was stuck on a flight of stairs about to enter a airplane and had to grip a handrail with extreme force. Pain is mild in severity, worse with twisting, worse with lifting extending the wrist. Localized to distal medial radial aspect. She believes there has been some swelling and redness overlying the site. Denies numbness or weakness in any region of the hand.  She also complains of dizziness that occurred earlier today that lasted no more than a second described as "drinking a strong drink of alcohol" which came on abruptly and resolved without any intervention. This occurred when she was going from indoors to outdoors. Nothing particularly made it better or worse that she's aware of. She's never had this experience before. She denies any motor sensory disturbances experienced before during or after this episode other than above.  Denies fevers, chills, nasal congestion, otic complaints.   Review Of Systems Outlined In HPI  Past Medical History  Diagnosis Date  . Weakness of left side of body     chronic/status post brain surgery  . 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 Cholecystectomy    . Rt knee surgery      surgical repair of ligaments X 2  . Brain surgery     No family history on file.  History   Social History  . Marital Status: Widowed    Spouse Name: N/A    Number of Children: N/A  . Years of Education: N/A   Occupational History  . Not on file.   Social History Main Topics  . Smoking status: Former Research scientist (life sciences)  . Smokeless tobacco: Not on file  . Alcohol Use: No  . Drug Use: No  . Sexual Activity:    Other Topics Concern  . Not on file   Social History Narrative  . No narrative on file     Objective: BP 130/70  Pulse 68  Wt 211 lb  (95.709 kg)  General: Alert and Oriented, No Acute Distress HEENT: Pupils equal, round, reactive to light. Conjunctivae clear.  External ears unremarkable, canals clear with intact TMs with appropriate landmarks.  Middle ear appears open without effusion. Pink inferior turbinates.  Moist mucous membranes, pharynx without inflammation nor lesions.  Neck supple without palpable lymphadenopathy nor abnormal masses. Cranial nerves II through XII grossly intact Lungs: Clear comfortable work of breathing Cardiac: Regular rate and rhythm. Normal S1/S2.  No murmurs, rubs, nor gallops.   Extremities: No peripheral edema.  Strong peripheral pulses. Full range of motion and strength in the right wrist without overlying swelling or redness. Pain is reproduced with resisted wrist adduction and passive wrist abduction. No pain in anatomical snuff box, Finkelstein's is negative Mental Status: No depression, anxiety, nor agitation. Skin: Warm and dry.  Assessment & Plan: April Dickerson was seen today for dizziness.  Diagnoses and associated orders for this visit:  Wrist pain  Dizziness    Wrist pain: Discussed I believe she sprained her wrist and aggressive treatment would include immobilization however this should improve on its own within the next one to 2 weeks with relative rest. She understandably declined immobilization, consider ibuprofen as needed for pain Dizziness: Reassurance provided this was most likely due to extreme stress that she has been under  over the past week and that her sympathetic nervous system is overly active right now and overly responsive to changes in environment/stimuli. Signs and symptoms requring emergent/urgent reevaluation were discussed with the patient. Orthostatics were negative today  25 minutes spent face-to-face during visit today of which at least 50% was counseling or coordinating care regarding: 1. Wrist pain   2. Dizziness       Return if symptoms worsen or  fail to improve.

## 2013-09-10 ENCOUNTER — Telehealth: Payer: Self-pay | Admitting: Family Medicine

## 2013-09-10 NOTE — Telephone Encounter (Signed)
I did read his note about the wrist and the dizziness.  If wrist is still swollen and not improving then recommend f/u early next week.  If still very sore then recommend an ACE wrap for support and mild compression for swelling.  As far a dizziness see if she is still having any ear problems. I saw that she had seen Jade for ear issues in [place] and sometimes that can cause dizziness as well.  See if can get more details as to her concern if able to. Thank you Tonya.

## 2013-09-10 NOTE — Telephone Encounter (Signed)
Patient called advised that she was seen yesterday by Dr. Ileene Rubens and stated he seemed very immature and unsure when he saw her in regards to her hand problem. On her account it states she was being seen for dizzy spells (not sure where the hand situation came from. But she wanted me to advise you that she did not care for him and wanted to know your thoughts in regards to her visit from yesterday and if she needs to come back in to see you or Luvenia Starch for the same situation because she feels nothing was accomplished in her visit yesterday. Thanks

## 2013-09-14 ENCOUNTER — Encounter: Payer: Self-pay | Admitting: Family Medicine

## 2013-09-14 ENCOUNTER — Ambulatory Visit (INDEPENDENT_AMBULATORY_CARE_PROVIDER_SITE_OTHER): Payer: Medicare Other | Admitting: Family Medicine

## 2013-09-14 ENCOUNTER — Other Ambulatory Visit: Payer: Self-pay | Admitting: Physician Assistant

## 2013-09-14 VITALS — BP 131/71 | HR 65 | Wt 207.0 lb

## 2013-09-14 DIAGNOSIS — M25531 Pain in right wrist: Secondary | ICD-10-CM

## 2013-09-14 DIAGNOSIS — M25539 Pain in unspecified wrist: Secondary | ICD-10-CM

## 2013-09-14 LAB — TSH: TSH: 4.094 u[IU]/mL (ref 0.350–4.500)

## 2013-09-14 LAB — LIPID PANEL
CHOL/HDL RATIO: 3.1 ratio
Cholesterol: 191 mg/dL (ref 0–200)
HDL: 61 mg/dL (ref >39)
LDL CALC: 114 mg/dL — AB (ref 0–99)
Triglycerides: 80 mg/dL (ref <150)
VLDL: 16 mg/dL (ref 0–40)

## 2013-09-14 LAB — COMPLETE METABOLIC PANEL WITH GFR
ALK PHOS: 49 U/L (ref 39–117)
ALT: 13 U/L (ref 0–35)
AST: 13 U/L (ref 0–37)
Albumin: 3.9 g/dL (ref 3.5–5.2)
BILIRUBIN TOTAL: 0.8 mg/dL (ref 0.2–1.2)
BUN: 8 mg/dL (ref 6–23)
CO2: 24 mEq/L (ref 19–32)
Calcium: 9.5 mg/dL (ref 8.4–10.5)
Chloride: 107 mEq/L (ref 96–112)
Creat: 0.72 mg/dL (ref 0.50–1.10)
GFR, Est African American: >89 mL/min
GFR, Est Non African American: 83 mL/min
Glucose, Bld: 90 mg/dL (ref 70–99)
Potassium: 4.3 mEq/L (ref 3.5–5.3)
SODIUM: 139 meq/L (ref 135–145)
TOTAL PROTEIN: 6.5 g/dL (ref 6.0–8.3)

## 2013-09-14 NOTE — Telephone Encounter (Signed)
Pt seen in ofc today.April Dickerson

## 2013-09-14 NOTE — Patient Instructions (Signed)
Can ACE wrap if needed if pain or swelling is worse.

## 2013-09-14 NOTE — Progress Notes (Signed)
   Subjective:    Patient ID: April Dickerson, female    DOB: [birth year], 73 y.o.   MRN: [id number]  HPI Right wrist pain followup - per patient, approximately 50% bettter but when has to work on her computer it does aggravate her symptoms. She experiences more pain and discomfort with this. She's been trying to rest her wrist.  She's not currently using any type of splint or brace. She does feel like the swelling has improved but is still present. She was unable to use her cane initially because of the pressure on her hand but is now able to use her cane again.    Review of Systems     Objective:   Physical Exam  Constitutional: She appears well-developed and well-nourished.  HENT:  Head: Normocephalic and atraumatic.  Musculoskeletal:  She has some swelling over the anterior wrist on the right hand. She still mildly tender there. No erythema. No skin lesions or rash. Normal flexion and extension. I am able to bend her side to side with some discomfort but normal range of motion. Strength in the first finger, fifth finger and thumb is normal. Radial pulse 2+.  Skin: Skin is warm and dry.  Psychiatric: Her behavior is normal.          Assessment & Plan:  Right wrist sprain-improving as expected. Continue to rest. She can use an Ace wrap if swelling worsens or if pain worsens. It can provide an extra support and compression if needed. Can use an anti-inflammatory as needed as well. Followup in 2-3 weeks if not continuing to improve or if symptoms suddenly worsen.

## 2013-09-16 LAB — T4, FREE: Free T4: 1.19 ng/dL (ref 0.80–1.80)

## 2013-09-30 ENCOUNTER — Encounter: Payer: Self-pay | Admitting: Family Medicine

## 2013-09-30 ENCOUNTER — Encounter: Payer: Medicare Other | Admitting: Physician Assistant

## 2013-09-30 ENCOUNTER — Ambulatory Visit (INDEPENDENT_AMBULATORY_CARE_PROVIDER_SITE_OTHER): Payer: Medicare Other | Admitting: Family Medicine

## 2013-09-30 ENCOUNTER — Encounter: Payer: Self-pay | Admitting: *Deleted

## 2013-09-30 VITALS — BP 125/85 | HR 59 | Ht 69.0 in | Wt 210.0 lb

## 2013-09-30 DIAGNOSIS — I10 Essential (primary) hypertension: Secondary | ICD-10-CM

## 2013-09-30 DIAGNOSIS — Z1211 Encounter for screening for malignant neoplasm of colon: Secondary | ICD-10-CM

## 2013-09-30 DIAGNOSIS — Z0000 Encounter for general adult medical examination without abnormal findings: Secondary | ICD-10-CM

## 2013-09-30 DIAGNOSIS — Z1231 Encounter for screening mammogram for malignant neoplasm of breast: Secondary | ICD-10-CM

## 2013-09-30 NOTE — Progress Notes (Signed)
Subjective:    April Dickerson is a 73 y.o. female who presents for Medicare Annual/Subsequent preventive examination.  Preventive Screening-Counseling & Management  Tobacco History  Smoking status  . Former Smoker  Smokeless tobacco  . Not on file     Problems Prior to Visit 1. Has next eye appt in October.    Current Problems (verified) Patient Active Problem List   Diagnosis Date Noted  . Postconcussion syndrome 06/22/2013  . Hemiparesis, left 06/22/2013  . Headache(784.0) 06/22/2013  . Chronic pain syndrome 06/22/2013  . Neuralgia 05/15/2013  . 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 Trigger middle finger of right hand 12/25/2011  . Osteoarthritis of right knee 12/25/2011  . RENAL CYST 03/27/2010  . UNSPECIFIED HYPOTHYROIDISM 04/19/2009  . HYPERTENSION, BENIGN 04/19/2009    Medications Prior to Visit Current Outpatient Prescriptions on File Prior to Visit  Medication Sig Dispense Refill  . diphenoxylate-atropine (LOMOTIL) 2.5-0.025 MG per tablet Take 1 tablet by mouth 4 (four) times daily as needed for diarrhea or loose stools.  30 tablet  1  . HYDROcodone-acetaminophen (NORCO/VICODIN) 5-325 MG per tablet Take 1 tablet by mouth every 6 (six) hours as needed for moderate pain.  30 tablet  0  . Multiple Vitamins-Minerals (OCUVITE ADULT FORMULA PO) Take 1 tablet by mouth daily.       No current facility-administered medications on file prior to visit.    Current Medications (verified) Current Outpatient Prescriptions  Medication Sig Dispense Refill  . diphenoxylate-atropine (LOMOTIL) 2.5-0.025 MG per tablet Take 1 tablet by mouth 4 (four) times daily as needed for diarrhea or loose stools.  30 tablet  1  . HYDROcodone-acetaminophen (NORCO/VICODIN) 5-325 MG per tablet Take 1 tablet by mouth every 6 (six) hours as needed for moderate pain.  30 tablet  0  . Multiple Vitamins-Minerals (OCUVITE ADULT FORMULA PO) Take 1 tablet by mouth daily.       No  current facility-administered medications for this visit.     Allergies (verified) Levothyroxine and Penicillins   PAST HISTORY  Family History No family history on file.  Social History History  Substance Use Topics  . Smoking status: Former Research scientist (life sciences)  . Smokeless tobacco: Not on file  . Alcohol Use: No     Are there smokers in your home (other than you)? No  Risk Factors Current exercise habits: The patient does not participate in regular exercise at present.  Dietary issues discussed: None   Cardiac risk factors: none.  Depression Screen (Note: if answer to either of the following is "Yes", a more complete depression screening is indicated)   Over the past two weeks, have you felt down, depressed or hopeless? No  Over the past two weeks, have you felt little interest or pleasure in doing things? No  Have you lost interest or pleasure in daily life? No  Do you often feel hopeless? No  Do you cry easily over simple problems? No  Activities of Daily Living In your present state of health, do you have any difficulty performing the following activities?:  Driving? No Managing money?  No Feeding yourself? No Getting from bed to chair? No  Climbing a flight of stairs? Yes Preparing food and eating?: No Bathing or showering? No Getting dressed: No Getting to the toilet? No Using the toilet:No Moving around from place to place: No In the past year have you fallen or had a near fall?:No   Are you sexually active?  No  Do you have more  than one partner?  No  Hearing Difficulties: No Do you often ask people to speak up or repeat themselves? No Do you experience ringing or noises in your ears? No Do you have difficulty understanding soft or whispered voices? No   Do you feel that you have a problem with memory? No  Do you often misplace items? No  Do you feel safe at home?  No  Cognitive Testing  Alert? Yes  Normal Appearance?Yes  Oriented to person? Yes  Place? Yes    Time? Yes  Recall of three objects?  Yes  Can perform simple calculations? Yes  Displays appropriate judgment?Yes  Can read the correct time from a watch face?Yes   Advanced Directives have been discussed with the patient? No, and doesn't want any additional information.   List the Names of Other Physician/Practitioners you currently use: 1.  Dr. Berdine Addison, Neuro 2. Dr. Ballard Russell - ortho 3. Dr. Anderson Malta - Ophtho  Indicate any recent Medical Services you may have received from other than [facility] providers in the past year (date may be approximate).  Immunization History  Administered Date(s) Administered  . PPD Test 12/18/2010  . Td 05/08/2003    Screening Tests Health Maintenance  Topic Date Due  . Colonoscopy  08/30/1990  . Zostavax  08/29/2000  . Pneumococcal Polysaccharide Vaccine Age 83 And Over  08/29/2005  . Mammogram  05/07/2008  . Tetanus/tdap  05/07/2013  . Influenza Vaccine  12/05/2013    All answers were reviewed with the patient and necessary referrals were made:  Tracey Stewart, MD   09/30/2013   History reviewed: allergies, current medications, past family history, past medical history, past social history, past surgical history and problem list  Review of Systems A comprehensive review of systems was negative.    Objective:     Vision by Snellen chart: She has had an eye exam in the last 12 months. She's due for followup in October.  Filed Vitals:   09/30/13 0854  Height: 5\' 9"  (1.753 m)  Weight: 210 lb (95.255 kg)    General Appearance:    Alert, cooperative, no distress, appears stated age  Head:    Normocephalic, without obvious abnormality, atraumatic  Eyes:    PERRL, conjunctiva/corneas clear, EOM's intact, both eyes  Ears:    Normal TM's and external ear canals, both ears  Nose:   Nares normal, septum midline, mucosa normal, no drainage    or sinus tenderness  Throat:   Lips, mucosa, and tongue normal; teeth and gums normal  Neck:   Supple,  symmetrical, trachea midline, no adenopathy;    thyroid:  no enlargement/tenderness/nodules; no carotid   bruit or JVD  Back:     Symmetric, no curvature, ROM normal, no CVA tenderness  Lungs:     Clear to auscultation bilaterally, respirations unlabored  Chest Wall:    No tenderness or deformity   Heart:    Regular rate and rhythm, S1 and S2 normal, rub   or gallop, soft systolic mumur best heard at the right sternal border.   Breast Exam:    No tenderness, masses, or nipple abnormality  Abdomen:     Soft, non-tender, bowel sounds active all four quadrants,    no masses, no organomegaly  Genitalia:    Not peformed  Rectal:    Not performed  Extremities:   Extremities normal, atraumatic, no cyanosis or edema  Pulses:   2+ and symmetric all extremities  Skin:   Skin color, texture, turgor normal, no  rashes or lesions  Lymph nodes:   Cervical, supraclavicular, and axillary nodes normal  Neurologic:   CNII-XII intact, walks with a cane. neeeded assistance to get on the exam table. Reflexes 2+ bilaterally.        Assessment:     Annual Wellness Exam     Plan:     During the course of the visit the patient was educated and counseled about appropriate screening and preventive services including:    Pneumococcal vaccine   Colorectal cancer screening  shingles vaccine  EKG- EKG shows rate of 63 beats per minute, normal sinus rhythm. Possible old septal infarct. No prior hx of rheart dx Has HTN but well controlled.    Advanced rectus-patient does not have them and declines additional information  Continue to work on diet and exercise activity as tolerated.   Will schedule screening mammo and colonoscoyp  Diet review for nutrition referral? Yes ____  Not Indicated _X_   Patient Instructions (the written plan) was given to the patient.  Medicare Attestation I have personally reviewed: The patient's medical and social history Their use of alcohol, tobacco or illicit  drugs Their current medications and supplements The patient's functional ability including ADLs,fall risks, home safety risks, cognitive, and hearing and visual impairment Diet and physical activities Evidence for depression or mood disorders  The patient's weight, height, BMI, and visual acuity have been recorded in the chart.  I have made referrals, counseling, and provided education to the patient based on review of the above and I have provided the patient with a written personalized care plan for preventive services.     Katena Petitjean, MD   09/30/2013

## 2013-09-30 NOTE — Patient Instructions (Signed)
Keep up a regular exercise program and make sure you are eating a healthy diet Try to eat 4 servings of dairy a day, or if you are lactose intolerant take a calcium with vitamin D daily.  Your vaccines are up to date.   

## 2013-10-20 ENCOUNTER — Ambulatory Visit (INDEPENDENT_AMBULATORY_CARE_PROVIDER_SITE_OTHER): Payer: Medicare Other

## 2013-10-20 DIAGNOSIS — Z1231 Encounter for screening mammogram for malignant neoplasm of breast: Secondary | ICD-10-CM

## 2013-11-18 ENCOUNTER — Encounter: Payer: Self-pay | Admitting: Cardiology

## 2013-11-18 ENCOUNTER — Ambulatory Visit (INDEPENDENT_AMBULATORY_CARE_PROVIDER_SITE_OTHER): Payer: Medicare Other | Admitting: Cardiology

## 2013-11-18 VITALS — BP 126/80 | HR 63 | Ht 69.0 in | Wt 213.0 lb

## 2013-11-18 DIAGNOSIS — I10 Essential (primary) hypertension: Secondary | ICD-10-CM

## 2013-11-18 DIAGNOSIS — R9431 Abnormal electrocardiogram [ECG] [EKG]: Secondary | ICD-10-CM

## 2013-11-18 NOTE — Assessment & Plan Note (Signed)
Blood pressure is normal today on no medications. Follow and adjust if needed.

## 2013-11-18 NOTE — Patient Instructions (Signed)
Your physician recommends that you schedule a follow-up appointment in: AS NEEDED PENDING TEST RESULTS  Your physician has requested that you have an echocardiogram. Echocardiography is a painless test that uses sound waves to create images of your heart. It provides your doctor with information about the size and shape of your heart and how well your heart's chambers and valves are working. This procedure takes approximately one hour. There are no restrictions for this procedure. IN THE [place] [facility]

## 2013-11-18 NOTE — Progress Notes (Signed)
     HPI: 73 year old female for evaluation of abnormal electrocardiogram. Patient recently had a physical. Electrocardiogram performed and showed sinus rhythm, left ventricular hypertrophy, prior septal infarct cannot be excluded and nonspecific ST changes. Cardiology asked to evaluate. Patient denies dyspnea, chest pain, palpitations or syncope. Trace pedal edema and left lower extremity where she wears brace.  Current Outpatient Prescriptions  Medication Sig Dispense Refill  . diphenoxylate-atropine (LOMOTIL) 2.5-0.025 MG per tablet Take 1 tablet by mouth 4 (four) times daily as needed for diarrhea or loose stools.  30 tablet  1  . HYDROcodone-acetaminophen (NORCO/VICODIN) 5-325 MG per tablet Take 1 tablet by mouth every 6 (six) hours as needed for moderate pain.  30 tablet  0  . Multiple Vitamins-Minerals (OCUVITE ADULT FORMULA PO) Take 1 tablet by mouth daily.      . NON FORMULARY FENOBAB 64.8MG  DALY       No current facility-administered medications for this visit.    Allergies  Allergen Reactions  . Levothyroxine Other (See Comments)    Night sweats Insomnia   . Penicillins     Past Medical History  Diagnosis Date  . Weakness of left side of body     chronic/status post brain surgery  . 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 Cholecystectomy    . Rt knee surgery      surgical repair of ligaments X 2  . Brain surgery      History   Social History  . Marital Status: Widowed    Spouse Name: N/A    Number of Children: 3  . Years of Education: N/A   Occupational History  . Not on file.   Social History Main Topics  . Smoking status: Never Smoker   . Smokeless tobacco: Not on file  . Alcohol Use: Yes     Comment: Occasional  . Drug Use: No  . Sexual Activity: Not on file   Other Topics Concern  . Not on file   Social History Narrative  . No narrative on file    Family History  Problem Relation Age of Onset    . Heart disease Mother     "leaky valve"  . Heart disease Brother     Murmur    ROS: Some residual left upper and left lower extremity weakness from previous brain surgery but no fevers or chills, productive cough, hemoptysis, dysphasia, odynophagia, melena, hematochezia, dysuria, hematuria, rash, seizure activity, orthopnea, PND, claudication. Remaining systems are negative.  Physical Exam:   Blood pressure 126/80, pulse 63, height 5\' 9"  (1.753 m), weight 213 lb (96.616 kg).  General:  Well developed/obese in NAD Skin warm/dry Patient not depressed No peripheral clubbing Back-normal HEENT-normal/normal eyelids Neck supple/normal carotid upstroke bilaterally; no bruits; no JVD; no thyromegaly chest - CTA/ normal expansion CV - RRR/normal S1 and S2; no murmurs, rubs or gallops;  PMI nondisplaced Abdomen -NT/ND, no HSM, no mass, + bowel sounds, no bruit 2+ femoral pulses, no bruits Ext-no edema, chords, 2+ DP, Left lower extremity in brace Neuro-Some residual weakness and left upper and left lower extremity from previous brain surgery.  ECG 09/30/2013-sinus rhythm, cannot rule out prior septal infarct, left ventricular hypertrophy, nonspecific ST changes.

## 2013-11-18 NOTE — Assessment & Plan Note (Signed)
Patient's electrocardiogram shows sinus rhythm, left ventricular hypertrophy, prior septal infarct cannot be excluded and nonspecific ST changes. Her septal Q waves may be related to Lead placement. She is not having cardiac symptoms including no chest pain or dyspnea. I will plan an echocardiogram to assess wall motion. If normal I do not think further cardiac evaluation is necessary. She was also noted to have left ventricular hypertrophy on her electrocardiogram. Echo to better assess. If indeed she has LVH she would need close monitoring of her blood pressure.

## 2013-11-25 ENCOUNTER — Ambulatory Visit (HOSPITAL_BASED_OUTPATIENT_CLINIC_OR_DEPARTMENT_OTHER): Payer: Medicare Other

## 2013-12-02 ENCOUNTER — Ambulatory Visit (HOSPITAL_BASED_OUTPATIENT_CLINIC_OR_DEPARTMENT_OTHER): Payer: Medicare Other

## 2013-12-02 ENCOUNTER — Telehealth: Payer: Self-pay | Admitting: Cardiology

## 2013-12-02 NOTE — Telephone Encounter (Signed)
Pt wants to talk to you about the echo that she needs to get.

## 2013-12-02 NOTE — Telephone Encounter (Signed)
Spoke with pt, she is scheduled for an echo but can not afford to pay the 20% she would have to take care of. She would like top have another EKG done because she feels the one done by her primary care office was done incorrectly. Advised we will be back in the [place] office 12-16-13. She will come by the office at that time for me to repeat her EKG. Based on those results she will make the decision to have the echo done or not.

## 2013-12-17 ENCOUNTER — Telehealth: Payer: Self-pay | Admitting: *Deleted

## 2013-12-17 DIAGNOSIS — R9431 Abnormal electrocardiogram [ECG] [EKG]: Secondary | ICD-10-CM

## 2013-12-17 NOTE — Telephone Encounter (Signed)
Spoke with pt, she had requested having another EKG to make sure the first one she had was correct. EKG was completed yesterday in [place], dr Stanford Breed has reviewed the EKG and it is the same as the one from 09/30/13. Left message for pt to call

## 2013-12-17 NOTE — Telephone Encounter (Signed)
Spoke with pt, Aware of dr Jacalyn Lefevre interpretation of EKG. She is not interested in an echo at this time due to cost but is concerned about the changes on the EKG. She would like to see dr Stanford Breed again to discuss the benefits of the echo to help decide if she needs the testing done. Follow up scheduled

## 2013-12-22 ENCOUNTER — Telehealth: Payer: Self-pay | Admitting: Cardiology

## 2013-12-22 NOTE — Telephone Encounter (Signed)
Patient has a new symptom that she needs to discuss and she also states that Fredia Beets told her she could get a copy of her EKG.

## 2013-12-22 NOTE — Telephone Encounter (Signed)
Returned a call to patient. She called with complaints of some left  ankle swelling that will go away after sleeping at night. No pain just swelling. She does however note some pain in her leg above the knee into her upper thigh area. Recommended for patient to get some support socks or stocking from the pharmacy to see if it helps with the ankle swelling. Recommended to her that if her leg pain does not subside she should contact her PCP to look for causes. Patient also requests a copy of her last EKG to be mailed to her.

## 2014-01-20 ENCOUNTER — Encounter: Payer: Self-pay | Admitting: Cardiology

## 2014-01-20 ENCOUNTER — Ambulatory Visit (INDEPENDENT_AMBULATORY_CARE_PROVIDER_SITE_OTHER): Payer: Medicare Other | Admitting: Cardiology

## 2014-01-20 VITALS — BP 158/98 | HR 64 | Ht 69.0 in | Wt 212.0 lb

## 2014-01-20 DIAGNOSIS — I10 Essential (primary) hypertension: Secondary | ICD-10-CM

## 2014-01-20 DIAGNOSIS — R9431 Abnormal electrocardiogram [ECG] [EKG]: Secondary | ICD-10-CM

## 2014-01-20 NOTE — Assessment & Plan Note (Signed)
Patient's electrocardiogram shows sinus rhythm, left ventricular hypertrophy, prior septal infarct cannot be excluded and nonspecific ST changes. Her septal Q waves may be related to Lead placement. She is not having cardiac symptoms including no chest pain or dyspnea. I will plan an echocardiogram to assess wall motion. If normal I do not think further cardiac evaluation is necessary. She was also noted to have left ventricular hypertrophy on her electrocardiogram. Echo to better assess. If indeed she has LVH she would need close monitoring of her blood pressure.

## 2014-01-20 NOTE — Assessment & Plan Note (Signed)
Blood pressure is elevated.I have asked her to purchase a blood pressure cuff. I have asked her to keep records and if her blood pressure is elevated followup with primary care for initiation of antihypertensive.

## 2014-01-20 NOTE — Addendum Note (Signed)
Addended by: Cristopher Estimable on: 01/20/2014 04:33 PM   Modules accepted: Orders

## 2014-01-20 NOTE — Patient Instructions (Signed)
Your physician recommends that you schedule a follow-up appointment in: AS NEEDED  Your physician has requested that you have an echocardiogram. Echocardiography is a painless test that uses sound waves to create images of your heart. It provides your doctor with information about the size and shape of your heart and how well your heart's chambers and valves are working. This procedure takes approximately one hour. There are no restrictions for this procedure.   

## 2014-01-20 NOTE — Progress Notes (Signed)
      HPI: FU abnormal ECG; seen recently for this (noted to have possible septal MI and LVH). Echo recommended but pt unsure if necessary and returned to discuss today. Patient denies dyspnea, chest pain, palpitations or syncope.  Current Outpatient Prescriptions  Medication Sig Dispense Refill  . Acetaminophen (TYLENOL PO) Take by mouth as needed.      Marland Kitchen HYDROcodone-acetaminophen (NORCO/VICODIN) 5-325 MG per tablet Take 1 tablet by mouth every 6 (six) hours as needed for moderate pain.  30 tablet  0  . Multiple Vitamins-Minerals (OCUVITE ADULT FORMULA PO) Take 1 tablet by mouth daily.      . NON FORMULARY FENOBAB 64.8MG  DALY       No current facility-administered medications for this visit.     Past Medical History  Diagnosis Date  . Weakness of left side of body     chronic/status post brain surgery  . 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 Cholecystectomy    . Rt knee surgery      surgical repair of ligaments X 2  . Brain surgery      History   Social History  . Marital Status: Widowed    Spouse Name: N/A    Number of Children: 3  . Years of Education: N/A   Occupational History  . Not on file.   Social History Main Topics  . Smoking status: Never Smoker   . Smokeless tobacco: Not on file  . Alcohol Use: Yes     Comment: Occasional  . Drug Use: No  . Sexual Activity: Not on file   Other Topics Concern  . Not on file   Social History Narrative  . No narrative on file    ROS: no fevers or chills, productive cough, hemoptysis, dysphasia, odynophagia, melena, hematochezia, dysuria, hematuria, rash, seizure activity, orthopnea, PND, pedal edema, claudication. Remaining systems are negative.  Physical Exam: Well-developed obese in no acute distress.  Skin is warm and dry.  HEENT is normal.  Neck is supple.  Chest is clear to auscultation with normal expansion.  Cardiovascular exam is regular rate and rhythm.    Abdominal exam nontender or distended. No masses palpated. Extremities show no edema.

## 2014-01-27 ENCOUNTER — Ambulatory Visit (HOSPITAL_BASED_OUTPATIENT_CLINIC_OR_DEPARTMENT_OTHER)
Admission: RE | Admit: 2014-01-27 | Discharge: 2014-01-27 | Disposition: A | Discharge status: Home / Self Care | Payer: Medicare Other | Source: Ambulatory Visit | Attending: Cardiology | Admitting: Cardiology

## 2014-01-27 DIAGNOSIS — R9431 Abnormal electrocardiogram [ECG] [EKG]: Secondary | ICD-10-CM

## 2014-01-27 DIAGNOSIS — I10 Essential (primary) hypertension: Secondary | ICD-10-CM | POA: Insufficient documentation

## 2014-01-27 DIAGNOSIS — I359 Nonrheumatic aortic valve disorder, unspecified: Secondary | ICD-10-CM

## 2014-01-27 NOTE — Progress Notes (Signed)
*  PRELIMINARY RESULTS* Echocardiogram 2D Echocardiogram has been performed.  April Dickerson 01/27/2014, 11:19 AM

## 2014-05-14 ENCOUNTER — Ambulatory Visit (INDEPENDENT_AMBULATORY_CARE_PROVIDER_SITE_OTHER): Payer: Medicare Other | Admitting: Physician Assistant

## 2014-05-14 ENCOUNTER — Encounter: Payer: Self-pay | Admitting: Physician Assistant

## 2014-05-14 VITALS — BP 131/70 | HR 66 | Wt 204.0 lb

## 2014-05-14 DIAGNOSIS — M545 Low back pain, unspecified: Secondary | ICD-10-CM

## 2014-05-14 DIAGNOSIS — R208 Other disturbances of skin sensation: Secondary | ICD-10-CM

## 2014-05-14 DIAGNOSIS — M21372 Foot drop, left foot: Secondary | ICD-10-CM

## 2014-05-14 DIAGNOSIS — R200 Anesthesia of skin: Secondary | ICD-10-CM

## 2014-05-14 MED ORDER — GABAPENTIN 100 MG PO CAPS: 100.0000 mg | ORAL_CAPSULE | Freq: Three times a day (TID) | ORAL | Status: DC

## 2014-05-14 MED ORDER — PREDNISONE (PAK) 10 MG PO TABS: ORAL_TABLET | ORAL | Status: DC

## 2014-05-14 NOTE — Progress Notes (Signed)
   Subjective:    Patient ID: April Dickerson, female    DOB: Jan 08, 1941, 74 y.o.   MRN: [id number]  HPI  Pt presents to the clinic with bilateral anterior upper thigh pain for last 25months. Persistently getting worse. Dr. Berdine Addison, neurologist, gave norco which does not help. Numbness not constent off and on but no known triggers. Has hx of left drop foot and weakness of extremity. Has off and on back pain but not sever.     Review of Systems  All other systems reviewed and are negative.      Objective:   Physical Exam  Constitutional: She is oriented to person, place, and time. She appears well-developed and well-nourished.  HENT:  Head: Normocephalic and atraumatic.  Cardiovascular: Normal rate, regular rhythm and normal heart sounds.   Pulmonary/Chest: Effort normal and breath sounds normal.  Musculoskeletal:  No pain over lumbar spine to palpation.  Pt not able to get on exam table for straight leg test.  No tenderness over bilateral hip.  No tenderness over thighs.  Strength of lower extemity 4/5, bilaterally.   Neurological: She is alert and oriented to person, place, and time.  Psychiatric: She has a normal mood and affect. Her behavior is normal.          Assessment & Plan:  Anterior thigh numbness/midline back pain without sciatica- will get low back xrays/pelvis. Could be some radiculopathy of lumbar spine. Certainly sounds like neuropathy. Pt given steriod pack to start and see if any improvement. If no improvement start neurontin up to three times a day. Discussed sedation warning. Only advance one dose every 3 days. Follow up as needed of if symptoms persist.

## 2014-05-16 DIAGNOSIS — M21372 Foot drop, left foot: Secondary | ICD-10-CM | POA: Insufficient documentation

## 2014-09-29 DIAGNOSIS — H2513 Age-related nuclear cataract, bilateral: Secondary | ICD-10-CM | POA: Insufficient documentation

## 2014-09-29 DIAGNOSIS — H402231 Chronic angle-closure glaucoma, bilateral, mild stage: Secondary | ICD-10-CM | POA: Insufficient documentation

## 2014-11-02 DIAGNOSIS — H40033 Anatomical narrow angle, bilateral: Secondary | ICD-10-CM | POA: Insufficient documentation

## 2014-12-02 LAB — HM DIABETES EYE EXAM

## 2014-12-09 ENCOUNTER — Encounter: Payer: Self-pay | Admitting: Family Medicine

## 2015-01-14 ENCOUNTER — Encounter: Payer: Self-pay | Admitting: Cardiology

## 2015-01-14 DIAGNOSIS — Z8669 Personal history of other diseases of the nervous system and sense organs: Secondary | ICD-10-CM | POA: Insufficient documentation

## 2015-01-17 ENCOUNTER — Ambulatory Visit: Payer: Medicare Other | Admitting: Physician Assistant

## 2016-07-16 ENCOUNTER — Telehealth: Payer: Self-pay | Admitting: Family Medicine

## 2017-02-06 NOTE — Telephone Encounter (Signed)
Issue resolved.

## 2017-03-27 ENCOUNTER — Ambulatory Visit (INDEPENDENT_AMBULATORY_CARE_PROVIDER_SITE_OTHER): Payer: Medicare Other | Admitting: Family Medicine

## 2017-03-27 ENCOUNTER — Encounter: Payer: Self-pay | Admitting: Family Medicine

## 2017-03-27 VITALS — BP 138/67 | HR 65 | Temp 97.9°F | Resp 16 | Wt 209.0 lb

## 2017-03-27 DIAGNOSIS — M1812 Unilateral primary osteoarthritis of first carpometacarpal joint, left hand: Secondary | ICD-10-CM | POA: Diagnosis not present

## 2017-03-27 MED ORDER — DICLOFENAC SODIUM 1 % TD GEL: 2.0000 g | Freq: Four times a day (QID) | TRANSDERMAL | 11 refills | Status: DC

## 2017-03-27 NOTE — Progress Notes (Addendum)
April Dickerson is a 76 y.o. female who presents to [facility] today for left thumb arthritis.  Madeline notes pain and swelling at the base of her left thumb.  This is been ongoing now for some time and has failed conservative management with rest and oral medications.  She feels well otherwise with no fevers or chills.  She notes the pain is present daily and worse with activity and interferes with her quality of life.   Past Medical History:  Diagnosis Date  . 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 Weakness of left side of body    chronic/status post brain surgery   Past Surgical History:  Procedure Laterality Date  . BRAIN SURGERY    . CHOLECYSTECTOMY    . rt knee surgery     surgical repair of ligaments X 2   Social History   Tobacco Use  . Smoking status: Never Smoker  . Smokeless tobacco: Never Used  Substance Use Topics  . Alcohol use: Yes    Comment: Occasional     ROS:  As above   Medications: Current Outpatient Medications  Medication Sig Dispense Refill  . Cod Liver Oil CAPS Take 1 capsule by mouth daily. 500 mg    . HYDROcodone-acetaminophen (NORCO/VICODIN) 5-325 MG per tablet Take 1 tablet by mouth every 6 (six) hours as needed for moderate pain. 30 tablet 0  . Multiple Vitamins-Minerals (OCUVITE ADULT FORMULA PO) Take 1 tablet by mouth daily.    . NON FORMULARY FENOBAB 64.8MG  DALY    . Turmeric Curcumin 500 MG CAPS Take 1 capsule by mouth daily.    . diclofenac sodium (VOLTAREN) 1 % GEL Apply 2 g topically 4 (four) times daily. To affected joint. 100 g 11   No current facility-administered medications for this visit.    Allergies  Allergen Reactions  . Levothyroxine Other (See Comments)    Night sweats Insomnia   . Penicillins      Exam:  BP 138/67   Pulse 65   Temp 97.9 F (36.6 C) (Oral)   Resp 16   Wt 209 lb (94.8 kg)   SpO2 98%   BMI 30.86 kg/m  General: Well Developed, well nourished, and  in no acute distress.  Neuro/Psych: Alert and oriented x3, extra-ocular muscles intact, able to move all 4 extremities, sensation grossly intact. Skin: Warm and dry, no rashes noted.  Respiratory: Not using accessory muscles, speaking in full sentences, trachea midline.  Cardiovascular: Pulses palpable, no extremity edema. Abdomen: Does not appear distended. MSK:  Left hand base of thumb first CMC joint bossing swollen and tender.  Pain with motion.  Procedure: Real-time Ultrasound Guided Injection of left 1st [facility]  Device: GE Logiq E  Images permanently stored and available for review in the ultrasound unit. Verbal informed consent obtained. Discussed risks and benefits of procedure. Warned about infection bleeding damage to structures skin hypopigmentation and fat atrophy among others. Patient expresses understanding and agreement Time-out conducted.  Noted no overlying erythema, induration, or other signs of local infection.  Skin prepped in a sterile fashion.  Local anesthesia: Topical Ethyl chloride.  With sterile technique and under real time ultrasound guidance: 5mg  dexamethasone and 0.64ml lidocaine injected easily.  Completed without difficulty  Pain immediately resolved suggesting accurate placement of the medication.  Advised to call if fevers/chills, erythema, induration, drainage, or persistent bleeding.  Images permanently stored and available for review in the ultrasound unit.  Impression: Technically successful ultrasound guided injection.  No results found for this or any previous visit (from the past 48 hour(s)). No results found.    Assessment and Plan: 76 y.o. female with base of left thumb arthritis status post injection today.  Additionally will treat with topical diclofenac gel.  Recheck as needed guided by pain.    No orders of the defined types were placed in this encounter.  Meds ordered this encounter  Medications  . diclofenac sodium  (VOLTAREN) 1 % GEL    Sig: Apply 2 g topically 4 (four) times daily. To affected joint.    Dispense:  100 g    Refill:  11    Discussed warning signs or symptoms. Please see discharge instructions. Patient expresses understanding.  I spent 25 minutes with this patient, greater than 50% was face-to-face time counseling regarding ddx and treatment plan.   Addendum: Weight was entered incorrectly as 259 lbs. It should have been 209. This was corrected on 04/09/17.

## 2017-03-27 NOTE — Patient Instructions (Addendum)
Thank you for coming in today. Call or go to the ER if you develop a large red swollen joint with extreme pain or oozing puss.   Use the diclofenac gel up to 4x daily.   If you insurance does not cover it well you can use the coupon and get it for $25   Recheck with me as needed

## 2017-04-08 ENCOUNTER — Telehealth: Payer: Self-pay | Admitting: Family Medicine

## 2017-04-08 NOTE — Telephone Encounter (Signed)
Patient was concerned that her weight was recorded incorrectly at her last visit. We have that she weighed 259 when she actually weighs 209. She would like [place] to fix this if at all possible.Thanks.

## 2017-04-09 ENCOUNTER — Encounter: Payer: Self-pay | Admitting: Family Medicine

## 2017-04-09 NOTE — Telephone Encounter (Signed)
Weight was corrected on the encounter in November. Pt will be notified.

## 2017-12-16 ENCOUNTER — Telehealth: Payer: Self-pay

## 2017-12-16 NOTE — Telephone Encounter (Signed)
Pt walked in requesting appt today because she has been experiencing some chest pain. Pt claims she only has chest pain at night when she lays down and it feels similar to indigestion. Pt does report a lot of stress in her life right now but denied pain radiating anywhere else, denied sweats, SOB, or nausea.  I advised pt that she should go be evaluated at Urgent Care since we cannot see her until tomorrow and she refuses. Pt states she wants to wait and see Dr Madilyn Fireman tomorrow.   I advised pt that if any new or worsening of SX she needs to go to ER pt agrees and states she will call 911 if this happens.  Pt also notes that she cannot lay on our tablet here since her surgery so she does not know if she can have EKG done, but does want to be evaluated and have blood work done, will go elsewhere for EKG if she has to.    FYI to PCP

## 2017-12-16 NOTE — Telephone Encounter (Signed)
Pt scheduled tomorrow

## 2017-12-16 NOTE — Telephone Encounter (Signed)
OK, will try to get her in tomorrow if at all possible.  We can double book if needed.  We can do an EKG sitting up she does not have to be completely flat on the table.

## 2017-12-17 ENCOUNTER — Encounter: Payer: Self-pay | Admitting: Cardiology

## 2017-12-17 ENCOUNTER — Ambulatory Visit: Payer: Medicare Other | Admitting: Family Medicine

## 2017-12-17 ENCOUNTER — Ambulatory Visit: Payer: Medicare Other | Admitting: Physician Assistant

## 2018-01-13 ENCOUNTER — Encounter: Payer: Self-pay | Admitting: Cardiology

## 2018-01-28 ENCOUNTER — Encounter: Payer: Self-pay | Admitting: Cardiology

## 2018-02-06 NOTE — Progress Notes (Signed)
Referring-Davis Cloward, MD Reason for referral-dyspnea and abnormal ECG  HPI: 77 year old female for evaluation of dyspnea and abnormal ECG at request of Gwenlyn Perking, MD.  I saw previously but not since January 20, 2014.  Echocardiogram September 2015 showed normal LV function, mild diastolic dysfunction and mild aortic insufficiency.  Patient seen recently for complaints of dyspnea (felt like I could not get a deep breath).  She was seen by her primary care physician and an electrocardiogram was performed which showed sinus bradycardia at a rate of 55.  Left ventricular hypertrophy, left axis deviation and nonspecific ST changes.  She was subsequently seen by Dr. Mauricio Po and an echocardiogram was performed and showed an ejection fraction of 50 to 41%, grade 1 diastolic dysfunction, mild LVH, mild aortic insufficiency.  Timolol eyedrops were discontinued.  Since that time her symptoms have completely resolved.  She denies dyspnea on exertion, orthopnea, PND, palpitations, syncope or chest pain.  She now presents for second opinion.  Current Outpatient Medications  Medication Sig Dispense Refill  . Cod Liver Oil CAPS Take 1 capsule by mouth daily. 500 mg    . HYDROcodone-acetaminophen (NORCO/VICODIN) 5-325 MG per tablet Take 1 tablet by mouth every 6 (six) hours as needed for moderate pain. 30 tablet 0  . latanoprost (XALATAN) 0.005 % ophthalmic solution Place 1 drop into both eyes at bedtime.    . Multiple Vitamins-Minerals (OCUVITE ADULT FORMULA PO) Take 1 tablet by mouth daily.    . NON FORMULARY FENOBAB 64.8MG  DALY    . Turmeric Curcumin 500 MG CAPS Take 1 capsule by mouth daily.     No current facility-administered medications for this visit.     Allergies  Allergen Reactions  . Levothyroxine Other (See Comments)    Night sweats Insomnia   . Penicillins      Past Medical History:  Diagnosis Date  . Hypothyroid   . 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 Weakness of left side of  body    chronic/status post brain surgery    Past Surgical History:  Procedure Laterality Date  . BRAIN SURGERY    . CHOLECYSTECTOMY    . rt knee surgery     surgical repair of ligaments X 2    Social History   Socioeconomic History  . Marital status: Widowed    Spouse name: Not on file  . Number of children: 3  . Years of education: Not on file  . Highest education level: Not on file  Occupational History  . Not on file  Social Needs  . Financial resource strain: Not on file  . Food insecurity:    Worry: Not on file    Inability: Not on file  . Transportation needs:    Medical: Not on file    Non-medical: Not on file  Tobacco Use  . Smoking status: Never Smoker  . Smokeless tobacco: Never Used  Substance and Sexual Activity  . Alcohol use: Yes    Frequency: Never    Comment: Rare  . Drug use: No  . Sexual activity: Not on file  Lifestyle  . Physical activity:    Days per week: Not on file    Minutes per session: Not on file  . Stress: Not on file  Relationships  . Social connections:    Talks on phone: Not on file    Gets together: Not on file    Attends religious service: Not on file    Active member of club or organization: Not  on file    Attends meetings of clubs or organizations: Not on file    Relationship status: Not on file  . Intimate partner violence:    Fear of current or ex partner: Not on file    Emotionally abused: Not on file    Physically abused: Not on file    Forced sexual activity: Not on file  Other Topics Concern  . Not on file  Social History Narrative  . Not on file    Family History  Problem Relation Age of Onset  . Heart disease Mother        "leaky valve"  . Heart disease Brother        Murmur    ROS: Unsteadiness from previous brain surgery but no fevers or chills, productive cough, hemoptysis, dysphasia, odynophagia, melena, hematochezia, dysuria, hematuria, rash, seizure activity, orthopnea, PND, pedal edema,  claudication. Remaining systems are negative.  Physical Exam:   Blood pressure (!) 156/76, pulse 61, height 5\' 9"  (1.753 m), weight 203 lb (92.1 kg), SpO2 98 %.  General:  Well developed/well nourished in NAD Skin warm/dry Patient not depressed No peripheral clubbing Back-normal HEENT-normal/normal eyelids Neck supple/normal carotid upstroke bilaterally; no bruits; no JVD; no thyromegaly chest - CTA/ normal expansion CV - RRR/normal S1 and S2; no murmurs, rubs or gallops;  PMI nondisplaced Abdomen -NT/ND, no HSM, no mass, + bowel sounds, no bruit 2+ femoral pulses, no bruits Ext-no edema, chords, 2+ DP Neuro-grossly nonfocal  ECG -December 17, 2017-performed at primary care physician's office-sinus rhythm, left ventricular hypertrophy, nonspecific ST changes.  Personally reviewed  A/P  1 dyspnea/fatigue-patient was complaining of dyspnea.  Her ECG showed sinus bradycardia with nonspecific ST changes.  Echocardiogram showed normal LV function.  Her atenolol eyedrops were discontinued and she states her symptoms have resolved.  She denies dyspnea on exertion or chest pain.  I do not think she requires further cardiac evaluation.  2 elevated blood pressure-patient states her blood pressure is typically normal.  She will follow this and medications can be added as indicated.  3 abnormal ECG-previous ECG as outlined above.  Echocardiogram shows normal LV function.  She is not having cardiac symptoms.  No plans for further evaluation.  Kirk Ruths, MD

## 2018-02-12 ENCOUNTER — Encounter: Payer: Self-pay | Admitting: Cardiology

## 2018-02-12 ENCOUNTER — Ambulatory Visit (INDEPENDENT_AMBULATORY_CARE_PROVIDER_SITE_OTHER): Payer: Medicare Other | Admitting: Cardiology

## 2018-02-12 VITALS — BP 156/76 | HR 61 | Ht 69.0 in | Wt 203.0 lb

## 2018-02-12 DIAGNOSIS — R0600 Dyspnea, unspecified: Secondary | ICD-10-CM | POA: Diagnosis not present

## 2018-02-12 DIAGNOSIS — R9431 Abnormal electrocardiogram [ECG] [EKG]: Secondary | ICD-10-CM | POA: Diagnosis not present

## 2018-02-12 DIAGNOSIS — R030 Elevated blood-pressure reading, without diagnosis of hypertension: Secondary | ICD-10-CM

## 2018-02-12 NOTE — Patient Instructions (Signed)
DR CRENSHAW WOULD LIKE TO SEE YOU BACK IN 6 MONTHS.   PLEASE GIVE OUR OFFICE A CALL 2 MONTHS PRIOR TO APPOINTMENT TIME TO SCHEDULE.

## 2018-12-10 ENCOUNTER — Ambulatory Visit: Admit: 2018-12-10 | Discharge: 2018-12-10 | Payer: MEDICARE | Attending: Neurology

## 2018-12-10 ENCOUNTER — Ambulatory Visit: Attending: Neurology | Primary: Family Medicine

## 2018-12-10 DIAGNOSIS — R29898 Other symptoms and signs involving the musculoskeletal system: Secondary | ICD-10-CM

## 2018-12-10 DIAGNOSIS — R569 Unspecified convulsions: Secondary | ICD-10-CM | POA: Insufficient documentation

## 2018-12-10 NOTE — Progress Notes (Signed)
Deanna Mullet, MD  [place], [place] [address]  [phone]  [email or web address]    NEUROLOGY CONSULT NOTE    Name Deanna Francis Age 78 y.o.   MRN [id number] DOB [birth year]   DATE  12/10/2018           Chief Complaint   Patient presents with   ??? Neurologic Problem        History of Present Illness:      The patient is a 78 y.o.  female who comes in due to a history of brain surgery.  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  The second surgery left her with left leg weakness.  This is been chronic.  She says she has occasional focal seizures which consist of single episodes of jerking of the left arm.  For these she takes an occasional phenobarbital.  She has been given opiates by her previous neurologist for diffuse pain.  She wants to come off of that.  She just moved to the area and is seeking to establish care.    Allergies not on file     Current Outpatient Medications   Medication Sig Dispense Refill   ??? PHENobarbitaL (LUMINAL) 60 mg tablet TAKE 1 TABLET BY MOUTH EVERY DAY     ??? HYDROcodone-acetaminophen (NORCO) 5-325 mg per tablet TAKE 1 TABLET BY MOUTH 3 TIMES A DAY AS NEEDED         Past Medical History:   Diagnosis Date   ??? Seizures (HCC)         Past Surgical History:   Procedure Laterality Date   ??? NEUROLOGICAL PROCEDURE UNLISTED          Social History     Tobacco Use   ??? Smoking status: Never Smoker   ??? Smokeless tobacco: Never Used   Substance Use Topics   ??? Alcohol use: Not Currently        History reviewed. No pertinent family history.     Review of Systems:     Per scanned sheet    Exam:       General Exam:  General:   Well developed, well nourished. Patient in no apparent distress   Head: Normocephalic, atraumatic, anicteric sclera     Ext: No pedal edema   Skin: No overt signs of rash or insect bites       Neurological Exam:  Mental Status: Alert and oriented to person place and time    Speech: Fluent no aphasia or dysarthria.   Cranial Nerves:   Intact visual fields.  Facial sensation is normal. Facial movement is symmetric.  Palate is midline.  Normal sternocleidomastoid strength. Tongue is midline. Hearing is intact bilaterally.   Eyes: Fundi reveal sharp optic discs appreciable venous pulsation. PERRL, EOM's full, no nystagmus, no ptosis.   Motor:   Left-sided weakness leg greater than arm   Reflexes:   Deep tendon reflexes brisk in the left leg   Sensory:   Symmetrically intact   Gait:  Gait is unstable   Tremor:   No tremor noted.   Cerebellar:  Normal finger to nose bilaterally.   Neurovascular: No carotid bruits. No JVD       Test Data:     CT head right frontal encephalomalacia       Assessment/Plan:     It is my impression the patient has a history of subdural evacuation 32 years ago  with left her with chronic left-sided weakness especially in the leg.  She has occasional possible focal seizures though they seem atypical for seizures.  At this point I recommended that we do a 24-hour EEG.  She is not taking phenobarbital properly and is not the appropriate medicine anyway.  I referred her to pain management to see if she needs to be on opiates for her joint pain.  I referred her for physical therapy and for a new left ankle brace.    Patient Active Problem List   Diagnosis Code   ??? Left leg weakness R29.898   ??? 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 REFERRAL TO PHYSICAL THERAPY   ??? REFERRAL FOR ORTHOTICS   ??? EEG 12-26 HR W/VIDEO ([facility] ONLY)       Follow-up and Dispositions    ?? Return in about 6 months (around 06/12/2019).          Marcheta GrammesLyle J Monnie Gudgel, MD    Note created electronically using Epic and voice recognition software, please excuse any typographic errors

## 2018-12-10 NOTE — Progress Notes (Signed)
Progress  Notes by Gilford Silvius, MD at 12/10/18 1230                Author: Gilford Silvius, MD  Service: --  Author Type: Physician       Filed: 12/10/18 1254  Encounter Date: 12/10/2018  Status: Signed          Editor: Gilford Silvius, MD (Physician)                   [name], MD   [place], [place] [address]  [phone]   [email or web address]      NEUROLOGY CONSULT NOTE           Name  Deanna Francis  Age  78 y.o.          MRN  [id number]  DOB  [birth year]          DATE   12/10/2018                    Chief Complaint       Patient presents with        ?  Neurologic Problem              History of Present Illness:         The patient is a 78 y.o.  female  who comes in due to a history of brain surgery.  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  The second surgery left her with left leg weakness.  This is been chronic.  She says she  has occasional focal seizures which consist of single episodes of jerking of the left arm.  For these she takes an occasional phenobarbital.  She has been given opiates by her previous neurologist for diffuse pain.  She wants to come off of that.  She  just moved to the area and is seeking to establish care.     Allergies not on file         Current Outpatient Medications          Medication  Sig  Dispense  Refill           ?  PHENobarbitaL (LUMINAL) 60 mg tablet  TAKE 1 TABLET BY MOUTH EVERY DAY               ?  HYDROcodone-acetaminophen (NORCO) 5-325 mg per tablet  TAKE 1 TABLET BY MOUTH 3 TIMES A DAY AS NEEDED                 Past Medical History:        Diagnosis  Date         ?  Seizures ([facility])                Past Surgical History:         Procedure  Laterality  Date          ?  NEUROLOGICAL PROCEDURE UNLISTED                  Social History          Tobacco Use         ?  Smoking status:  Never Smoker     ?  Smokeless tobacco:  Never Used       Substance Use Topics         ?  Alcohol use:  Not Currently            History reviewed. No pertinent family history.         Review of Systems:        Per scanned sheet        Exam:              General Exam:   General:     Well developed, well nourished. Patient in no apparent distress        Head:  Normocephalic, atraumatic, anicteric sclera           Ext:  No pedal edema        Skin:  No overt signs of rash or insect bites           Neurological Exam:      Mental Status:  Alert and oriented to person place and time        Speech:  Fluent no aphasia or dysarthria.     Cranial Nerves:    Intact visual fields.  Facial sensation is normal. Facial movement is symmetric.  Palate is midline.  Normal sternocleidomastoid strength. Tongue is midline.  Hearing is intact bilaterally.        Eyes:  Fundi reveal sharp optic discs appreciable venous pulsation. PERRL, EOM's full, no nystagmus, no ptosis.     Motor:    Left-sided weakness leg greater than arm     Reflexes:    Deep tendon reflexes brisk in the left leg     Sensory:    Symmetrically intact     Gait:   Gait is unstable     Tremor:    No tremor noted.        Cerebellar:   Normal finger to nose bilaterally.        Neurovascular:  No carotid bruits. No JVD             Test Data:        CT head right frontal encephalomalacia            Assessment/Plan:        It is my impression the patient has a history of subdural evacuation 32 years ago with left her with chronic left-sided weakness especially in the leg.  She has occasional possible focal seizures though they seem atypical for seizures.  At this point  I recommended that we do a 24-hour EEG.  She is not taking phenobarbital properly and is not the appropriate medicine anyway.  I referred her to pain management to see if she needs to be on opiates for her joint pain.  I referred her for physical therapy  and for a new left ankle brace.        Patient Active Problem List        Diagnosis  Code         ?  Left leg weakness  R29.898         ?  Focal  seizures (HCC)  R56.9             Orders Placed This Encounter        ?  REFERRAL TO PHYSICAL THERAPY     ?  REFERRAL FOR ORTHOTICS        ?  EEG 12-26 HR W/VIDEO ([facility] ONLY)             Follow-up and Dispositions      ??  Return in about 6 months (around 06/12/2019).  Gilford Silvius, MD      Note created electronically using Epic and voice recognition software, please excuse any typographic errors

## 2019-01-22 ENCOUNTER — Encounter: Attending: Family Medicine

## 2019-06-03 ENCOUNTER — Encounter

## 2019-07-03 ENCOUNTER — Ambulatory Visit: Admit: 2019-07-03 | Discharge: 2019-07-03 | Payer: MEDICARE | Attending: Family Medicine

## 2019-07-03 ENCOUNTER — Ambulatory Visit: Attending: Family Medicine | Primary: Family Medicine

## 2019-07-03 DIAGNOSIS — R067 Sneezing: Secondary | ICD-10-CM

## 2019-07-03 DIAGNOSIS — H4089 Other specified glaucoma: Secondary | ICD-10-CM | POA: Insufficient documentation

## 2019-07-03 MED ORDER — PHENOBARBITAL 60 MG TAB
60 mg | ORAL_TABLET | ORAL | 2 refills | Status: DC
Start: 2019-07-03 — End: 2020-12-20

## 2019-07-03 MED ORDER — PHENOBARBITAL 60 MG TAB
60 mg | ORAL_TABLET | ORAL | 1 refills | Status: DC
Start: 2019-07-03 — End: 2019-07-03

## 2019-07-03 MED ORDER — ACETAMINOPHEN 500 MG TAB
500 mg | ORAL_TABLET | Freq: Four times a day (QID) | ORAL | 5 refills | Status: AC | PRN
Start: 2019-07-03 — End: ?

## 2019-07-03 NOTE — Progress Notes (Signed)
ACUTE VISIT NOTE      Primary Care Physician: None     Reason for Visit:   Chief Complaint   Patient presents with   ??? Sneezing     Sneezing and need refill.         Assessment/Plan:  Diagnoses and all orders for this visit:    1. Sneezing  Comments:  - Likely allergy.  - Steroid nasal spray or saline nasal spray OTC +/- newer generation of antihistamines OTC. Pt has galucoma and avoid older antihistamines.    2. Focal seizures (HCC)  -     PHENobarbitaL (LUMINAL) 60 mg tablet; TAKE 1 TABLET BY MOUTH EVERY DAY.    3. Pedal edema  Comments:  - Low salt diet, elev legs.    Other orders  -     acetaminophen (TYLENOL) 500 mg tablet; Take 1 Tab by mouth every six (6) hours as needed for Pain.    - Watch for worsening sxs.  - Call for questions.  - F/up PRN sooner.     Call for questions/concerns over the phone. Reassurance provided. Answered all questions. Pt verbalized understanding.     *Total face-to-face time for this encounter at least 25 mins spent in counseling regarding diagnoses, risks and benefits of various treatment plans with expected outcomes as well as coordination of care.         History of Present Illness:  Deanna Francis is a 79 y.o. female patient who presents for sneezing for several days. Has not tried any meds at this time. No known sick contact. No concern for covid-19.  Denies fevers, chills, CP, SOB, palpitations, N/V, diarrhea, other sxs.      *ROS: SEE HPI*      Past Medical History:  Past Medical History:   Diagnosis Date   ??? Seizures ([place])         Past Surgical History:   Procedure Laterality Date   ??? HX CHOLECYSTECTOMY  1981   ??? NEUROLOGICAL PROCEDURE UNLISTED  1988, 1989    ?hydrocephalus; s/p drainage -> brain tissue excision -> foot drop       Medication:  Medications Ordered Today   Medications   ??? DISCONTD: PHENobarbitaL (LUMINAL) 60 mg tablet     Sig: TAKE 1 TABLET BY MOUTH EVERY DAY PRN.     Dispense:  30 Tab     Refill:  1   ??? PHENobarbitaL (LUMINAL) 60 mg tablet      Sig: TAKE 1 TABLET BY MOUTH EVERY DAY.     Dispense:  30 Tab     Refill:  2   ??? acetaminophen (TYLENOL) 500 mg tablet     Sig: Take 1 Tab by mouth every six (6) hours as needed for Pain.     Dispense:  60 Tab     Refill:  5       Allergy:  Allergies   Allergen Reactions   ??? Penicillins Other (comments)       Social History:   Social History     Socioeconomic History   ??? Marital status: WIDOWED     Spouse name: Not on file   ??? Number of children: 2   ??? Years of education: Not on file   ??? Highest education level: Not on file   Social Needs   ??? Financial resource strain: Not very hard   ??? Food insecurity     Worry: Never true     Inability: Never true   ???  Transportation needs     Medical: No     Non-medical: No   Tobacco Use   ??? Smoking status: Never Smoker   ??? Smokeless tobacco: Never Used   Substance and Sexual Activity   ??? Alcohol use: Not Currently   ??? Drug use: Not Currently   Lifestyle   ??? Physical activity     Days per week: 7 days     Minutes per session: 20 min   ??? Stress: Not at all   Relationships   ??? Social Wellsite geologist on phone: More than three times a week     Gets together: Twice a week     Attends religious service: Patient refused     Active member of club or organization: Yes     Attends meetings of clubs or organizations: Patient refused     Relationship status: Patient refused       Family History:  No family status information on file.     History reviewed. No pertinent family history.      Physical Exam:  Vitals:    07/03/19 1455   BP: 124/80   Pulse: 65   Resp: 15   Temp: 97.9 ??F (36.6 ??C)   SpO2: 98%   Weight: 186 lb (84.4 kg)   Height: 5\' 7"  (1.702 m)   Body mass index is 29.13 kg/m??.    GENERAL: Well developed, well nourished, not in distress.  EYES: Anicteric sclerae.  NECK: Supple, trachea midline.  RESPIRATORY: Clear to auscultation, no rhonchi/rales/wheezing.  CARDIOVASCULAR: Regular rate and rhythm, normal S1, S2; no murmur/gallop.  ABDOMEN: Soft, no tenderness.   EXTREMITY: Extremities with full ROM. + slight leg swelling bilateral.  NEURO: Alert, oriented x 3, normal gait; no gross motor deficits.  PSYCH: Good eye contact, appropriate affect/mood.       Follow-up and Dispositions    ?? Return in about 1 month (around 07/31/2019) for physical.           08/02/2019, MD    [facility]  [facility]

## 2019-07-03 NOTE — Assessment & Plan Note (Signed)
-   D/t foot drop

## 2019-07-03 NOTE — Patient Instructions (Signed)
Managing Your Allergies: Care Instructions  Your Care Instructions     Managing your allergies is an important part of staying healthy. Your doctor will help you find out what may be the cause of the allergies. Common causes of symptoms are house dust and dust mites, animal dander, mold, and pollen.  As soon as you know what triggers your symptoms, try to avoid those things. This can help prevent allergy symptoms, asthma, and other health problems.  Ask your doctor about allergy medicine or immunotherapy. These treatments may help reduce or prevent allergy symptoms.  Follow-up care is a key part of your treatment and safety. Be sure to make and go to all appointments, and call your doctor if you are having problems. It's also a good idea to know your test results and keep a list of the medicines you take.  How can you care for yourself at home?  ?? If you have been told by your doctor that dust or dust mites are causing your allergy, decrease the dust around your bed:  ? Wash sheets, pillowcases, and other bedding in hot water every week.  ? Use dust-proof covers for pillows, duvets, and mattresses. Avoid plastic covers because they tear easily and do not "breathe." Wash as instructed on the label.  ? Do not use any blankets and pillows that you do not need.  ? Use blankets that you can wash in your washing machine.  ? Consider removing drapes and carpets, which attract and hold dust, from your bedroom.  ?? If you are allergic to house dust and mites, do not use home humidifiers. Your doctor can suggest ways you can control dust and mites.  ?? Look for signs of cockroaches. Cockroaches cause allergic reactions. Use cockroach baits to get rid of them. Then, clean your home well. Cockroaches like areas where grocery bags, newspapers, empty bottles, or cardboard boxes are stored. Do not keep these inside your home, and keep trash and food containers sealed. Seal off any spots where cockroaches might enter your home.   ?? If you are allergic to mold, get rid of furniture, rugs, and drapes that smell musty. Check for mold in the bathroom.  ?? If you are allergic to outdoor pollen or mold spores, use air-conditioning. Change or clean all filters every month. Keep windows closed.  ?? If you are allergic to pollen, stay inside when pollen counts are high. Use a vacuum cleaner with a HEPA filter or a double-thickness filter at least two times each week.  ?? Stay inside when air pollution is bad. Avoid paint fumes, perfumes, and other strong odors.  ?? Avoid conditions that make your allergies worse. Stay away from smoke. Do not smoke or let anyone else smoke in your house. Do not use fireplaces or wood-burning stoves.  ?? If you are allergic to your pets, change the air filter in your furnace every month. Use high-efficiency filters.  ?? If you are allergic to pet dander, keep pets outside or out of your bedroom. Old carpet and cloth furniture can hold a lot of animal dander. You may need to replace them.  When should you call for help?   Give an epinephrine shot if:  ?? ?? You think you are having a severe allergic reaction.   After giving an epinephrine shot call 911, even if you feel better.  Call 911 if:  ?? ?? You have symptoms of a severe allergic reaction. These may include:  ? Sudden raised, red areas (  hives) all over your body.  ? Swelling of the throat, mouth, lips, or tongue.  ? Trouble breathing.  ? Passing out (losing consciousness). Or you may feel very lightheaded or suddenly feel weak, confused, or restless.   ?? ?? You have been given an epinephrine shot, even if you feel better.   Call your doctor now or seek immediate medical care if:  ?? ?? You have symptoms of an allergic reaction, such as:  ? A rash or hives (raised, red areas on the skin).  ? Itching.  ? Swelling.  ? Belly pain, nausea, or vomiting.   Watch closely for changes in your health, and be sure to contact your doctor if:  ?? ?? Your allergies get worse.    ?? ?? You need help controlling your allergies.   ?? ?? You have questions about allergy testing.   ?? ?? You do not get better as expected.   Where can you learn more?  Go to https://www.healthwise.net/[facility]  Enter L249 in the search box to learn more about "Managing Your Allergies: Care Instructions."  Current as of: November 03, 2018??????????????????????????????Content Version: 12.6  ?? 2006-2020 Healthwise, Incorporated.   Care instructions adapted under license by [employer] (which disclaims liability or warranty for this information). If you have questions about a medical condition or this instruction, always ask your healthcare professional. Healthwise, Incorporated disclaims any warranty or liability for your use of this information.

## 2019-07-03 NOTE — Progress Notes (Signed)
ACUTE VISIT NOTE      Primary Care Physician: None     Reason for Visit:   Chief Complaint   Patient presents with   ??? Sneezing     Sneezing and need refill.         Assessment/Plan:  Diagnoses and all orders for this visit:    1. Sneezing  Comments:  - Likely allergy.  - Steroid nasal spray or saline nasal spray OTC +/- newer generation of antihistamines OTC. Pt has galucoma and avoid older antihistamines.    2. Focal seizures (HCC)  -     PHENobarbitaL (LUMINAL) 60 mg tablet; TAKE 1 TABLET BY MOUTH EVERY DAY.    3. Pedal edema  Comments:  - Low salt diet, elev legs.    Other orders  -     acetaminophen (TYLENOL) 500 mg tablet; Take 1 Tab by mouth every six (6) hours as needed for Pain.    - Watch for worsening sxs.  - Call for questions.  - F/up PRN sooner.     Call for questions/concerns over the phone. Reassurance provided. Answered all questions. Pt verbalized understanding.     *Total face-to-face time for this encounter at least 25 mins spent in counseling regarding diagnoses, risks and benefits of various treatment plans with expected outcomes as well as coordination of care.         History of Present Illness:  Deanna Francis is a 79 y.o. female patient who presents for sneezing for several days. Has not tried any meds at this time. No known sick contact. No concern for covid-19.  Denies fevers, chills, CP, SOB, palpitations, N/V, diarrhea, other sxs.      *ROS: SEE HPI*      Past Medical History:  Past Medical History:   Diagnosis Date   ??? Seizures (HCC)         Past Surgical History:   Procedure Laterality Date   ??? HX CHOLECYSTECTOMY  1981   ??? NEUROLOGICAL PROCEDURE UNLISTED  1988, 1989    ?hydrocephalus; s/p drainage -> brain tissue excision -> foot drop       Medication:  Medications Ordered Today   Medications   ??? DISCONTD: PHENobarbitaL (LUMINAL) 60 mg tablet     Sig: TAKE 1 TABLET BY MOUTH EVERY DAY PRN.     Dispense:  30 Tab     Refill:  1   ??? PHENobarbitaL (LUMINAL) 60 mg tablet     Sig: TAKE 1  TABLET BY MOUTH EVERY DAY.     Dispense:  30 Tab     Refill:  2   ??? acetaminophen (TYLENOL) 500 mg tablet     Sig: Take 1 Tab by mouth every six (6) hours as needed for Pain.     Dispense:  60 Tab     Refill:  5       Allergy:  Allergies   Allergen Reactions   ??? Penicillins Other (comments)       Social History:   Social History     Socioeconomic History   ??? Marital status: WIDOWED     Spouse name: Not on file   ??? Number of children: 2   ??? Years of education: Not on file   ??? Highest education level: Not on file   Social Needs   ??? Financial resource strain: Not very hard   ??? Food insecurity     Worry: Never true     Inability: Never true   ???  Transportation needs     Medical: No     Non-medical: No   Tobacco Use   ??? Smoking status: Never Smoker   ??? Smokeless tobacco: Never Used   Substance and Sexual Activity   ??? Alcohol use: Not Currently   ??? Drug use: Not Currently   Lifestyle   ??? Physical activity     Days per week: 7 days     Minutes per session: 20 min   ??? Stress: Not at all   Relationships   ??? Social Product manager on phone: More than three times a week     Gets together: Twice a week     Attends religious service: Patient refused     Active member of club or organization: Yes     Attends meetings of clubs or organizations: Patient refused     Relationship status: Patient refused       Family History:  No family status information on file.     History reviewed. No pertinent family history.      Physical Exam:  Vitals:    07/03/19 1455   BP: 124/80   Pulse: 65   Resp: 15   Temp: 97.9 ??F (36.6 ??C)   SpO2: 98%   Weight: 186 lb (84.4 kg)   Height: 5\' 7"  (1.702 m)   Body mass index is 29.13 kg/m??.    GENERAL: Well developed, well nourished, not in distress.  EYES: Anicteric sclerae.  NECK: Supple, trachea midline.  RESPIRATORY: Clear to auscultation, no rhonchi/rales/wheezing.  CARDIOVASCULAR: Regular rate and rhythm, normal S1, S2; no murmur/gallop.  ABDOMEN: Soft, no tenderness.  EXTREMITY: Extremities with  full ROM. + slight leg swelling bilateral.  NEURO: Alert, oriented x 3, normal gait; no gross motor deficits.  PSYCH: Good eye contact, appropriate affect/mood.       Follow-up and Dispositions    ?? Return in about 1 month (around 07/31/2019) for physical.           Randolm Idol, [place]

## 2020-10-12 ENCOUNTER — Encounter: Attending: Family Medicine | Primary: Family Medicine

## 2020-12-20 ENCOUNTER — Ambulatory Visit
Admit: 2020-12-20 | Discharge: 2020-12-20 | Payer: MEDICARE | Attending: Student in an Organized Health Care Education/Training Program | Primary: Family Medicine

## 2020-12-20 ENCOUNTER — Ambulatory Visit: Attending: Student in an Organized Health Care Education/Training Program | Primary: Family Medicine

## 2020-12-20 DIAGNOSIS — R569 Unspecified convulsions: Secondary | ICD-10-CM

## 2020-12-20 MED ORDER — ASPIRIN 81 MG TAB, DELAYED RELEASE
81 mg | ORAL_TABLET | Freq: Every day | ORAL | 3 refills | Status: AC
Start: 2020-12-20 — End: ?

## 2020-12-20 MED ORDER — GABAPENTIN 100 MG CAP
100 mg | ORAL_CAPSULE | Freq: Two times a day (BID) | ORAL | 3 refills | Status: AC | PRN
Start: 2020-12-20 — End: ?

## 2020-12-20 MED ORDER — PHENOBARBITAL 32.4 MG TAB
32.4 mg | ORAL_TABLET | Freq: Two times a day (BID) | ORAL | 3 refills | Status: AC
Start: 2020-12-20 — End: ?

## 2020-12-20 MED ORDER — ATORVASTATIN 40 MG TAB
40 mg | ORAL_TABLET | Freq: Every day | ORAL | 3 refills | Status: AC
Start: 2020-12-20 — End: ?

## 2020-12-20 NOTE — Progress Notes (Signed)
Progress Notes by Tanya Nones, MD at 12/20/20 1530                Author: Tanya Nones, MD  Service: --  Author Type: Physician       Filed: 12/20/20 1645  Encounter Date: 12/20/2020  Status: Addendum          Editor: Tanya Nones, MD (Physician)          Related Notes: Original Note by Tanya Nones, MD (Physician) filed at 12/20/20 1643                       Tanya Nones, MD   Progress Note            Patient: Deanna Francis        Sex: female          DOA: (Not on file)   Date of Birth:  March 19, 1941      Age:  80 y.o.                      Interval History:        Deanna Francis is a 80 y.o. woman who comes in for a potential TIA.       On June 29th she was talking with a friend over the phone and then she hung up when she suddenly started to speak "jibberish". She looked through her phone looking for an EMS number and after 10-15 seconds she was able to speak normally again. When she  tried to call for help during the episode, she correctly pulled out her phone but instead of dialing "911" she dailed "redial" and her friend picked up. Her friend knew there was a problem and called the paramedics for her.       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 She takes Norco for this to alleviate the pain.       2 months ago went to see another Neurologist and she could not obtain an MRI because of cost concerns, and felt that she could not proceed with the workup.       Most of the time she is out with friends and spends time away from the facility. She cannot walk up ramps and steep roads because of leg weakness. One of her friends drove her to this appointment.       Medications: latanoprost eye solution, phenobarbital 60mg  PRN for seizure, Norco PRN   PMH: focal seizures    PSH: gallbladdder out in 1981, 2 arthroscopic sureries on R knee, 2 vaginal deliveries in the past (1 miscarriage)   Smoke: never smoker, alcohol no  longer uses but used to drink socially back when she was younger      Of note, she has left sided weakness as a result of a subdural hematoma in the past which was operated on and left her with weakness. She also had partial seizures after that surgery which she has controlled with PRN phenobarbital.           REVIEW OF SYSTEMS: NO CP, SOB, N,V,D, F,C        Current Outpatient Medications         Medication  Sig  Dispense          ?  aspirin delayed-release 81 mg tablet  Take 1 Tablet by mouth daily.  90 Tablet     ?  atorvastatin (LIPITOR) 40 mg tablet  Take 1 Tablet by mouth daily.  90 Tablet     ?  PHENobarbitaL (LUMINAL) 32.4 mg tablet  Take 1 Tablet by mouth two (2) times a day. Max Daily Amount: 64.8 mg.  90 Tablet     ?  gabapentin (NEURONTIN) 100 mg capsule  Take 1 Capsule by mouth two (2) times daily as needed for Pain.  45 Capsule     ?  latanoprost (XALATAN) 0.005 % ophthalmic solution  Administer 1 Drop to both eyes nightly.       ?  acetaminophen (TYLENOL) 500 mg tablet  Take 1 Tab by mouth every six (6) hours as needed for Pain.  60 Tab          ?  HYDROcodone-acetaminophen (NORCO) 5-325 mg per tablet  TAKE 1 TABLET BY MOUTH 3 TIMES A DAY AS NEEDED            No current facility-administered medications for this visit.          Objective:         There were no vitals taken for this visit.   There is no height or weight on file to calculate BMI.         GEN: Appears stated age   EYES: conjunctiva normal   HEENT: Normocephalic and atraumatic         General       Appears stated age       Mental Status       A&O x 3 Fluent, lucid, and coherent       Cranial Nerves       EOMS full VFF pupils equal and reactive face symmetrical facial sensation intact tongue ML, palate raises symmetrically       Motor       5/5 in RUE and RLE. LUE with pronator drift and 4th/5th digits partially contracted at baseline due to weakness.       Sensation       Intact to LT and cold temperature       Reflexes       DTRs 2+ in  RUE and R/L patellar. LUE 2+ brisk throughout       Gait.Cerebellum       Normal  finger to nose testing, gait deferred                 All the patient's labs over the past 24 hours were reviewed both during my initial daily workflow process and at the time notated as "note time" in [facility].  (It is not time stamped separately  in this workflow.)  Select labs are listed below.         Lab/Data Reviewed:   No results found for this or any previous visit (from the past 24 hour(s)).   CT Results (most recent):   No results found for this or any previous visit.      MRI Results (most recent):   No results found for this or any previous visit.              Assessment/Plan          Patient Active Problem List        Diagnosis  Code         ?  Left leg weakness  R29.898     ?  Focal seizures (HCC)  R56.9         ?  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 woman who comes in for a potential TIA. Her symptom involved several seconds of an inability to speak but she never lost consciousness. She has stroke risk factors and this could have represented a TIA. However, she also  has a history of seizures for which she has not historically taken seizure medication as prescribed. Therefore, this could have represented a breakthrough partial seizure without loss of consciousness.      Plan:      #partial seizure vs. TIA   - EEG spot   - MRI brain wo contrast    - CBC, CMP, lipid panel, HbA1c, TSH   - aspirin 81mg  qday, atorvastatin 40mg  qhs for secondary stroke prevention   - phenobarbital 32.4mg  BID prescribed. Patient has not been taking any seizure medication (she was using phenobarbital 60mg  as a PRN medication). Therefore, I gave her the option of starting keppra since it has less potent side effects and could offer  better seizure control for her in her situation. However, she insisted on phenobarbital resumption and therefore I have prescribed phenobarbital for seizure control   - No  driving - I am NOT reporting the patient to [facility] for suspension of license at this time because she did not have a loss of consciousness episode. She also agrees to give her car back to her daughter and stop driving. She was only driving twice per  a week before this appointment with me as her friends usually drive her places and she gets groceries delivered   - seizure precautions      #numbness/tingling in LLE occassionally   - gabapentin 100mg  BID PRN for pain, can consider increasing in the future if helpful   - please note that gabapentin is a partial seizure medication as well, so this will give her added control of partial seizures            , MD

## 2021-01-16 ENCOUNTER — Telehealth

## 2021-01-16 NOTE — Telephone Encounter (Signed)
Pt is scheduled for Mri on 01-19-21 she call ask for few   pills of Valium to keep her come prior this test .    Ph. [phone]

## 2021-01-17 MED ORDER — DIAZEPAM 2 MG TAB
2 mg | ORAL_TABLET | Freq: Once | ORAL | 0 refills | Status: AC
Start: 2021-01-17 — End: 2021-01-17

## 2021-01-17 NOTE — Telephone Encounter (Signed)
I have prescribed her valium for the MRI brain study for her to take prior to exam.     I also called her to counsel her on how to take medication, and that she should not operate any heavy machinery or perform any other dangerous tasks while on the medication. She told me she will have a friend pick her up and drop her off at the appointment.     At one point during the encounter she became agitated saying that she is upset with all doctors about not getting medications she would like such as sedatives, and potent opioid pain medications. I redirected the conversation for [place] to focus on the current issue at hand and she expressed her understanding of the medication she would get for the procedure.

## 2021-01-18 NOTE — Telephone Encounter (Signed)
Pt called and states that the 2mg  tablet of valium will not work for her to take her MRI.  She is requesting 2 5mg  pills to be able to take the test and if you do not want to prescribe this much she will not be able to take the MRI, and would like a call on what she can do if she doesn't take the test.

## 2021-01-19 ENCOUNTER — Ambulatory Visit: Payer: MEDICARE | Primary: Family Medicine

## 2021-01-24 ENCOUNTER — Telehealth

## 2021-01-24 MED ORDER — DIAZEPAM 2 MG TAB
2 mg | ORAL_TABLET | Freq: Once | ORAL | 0 refills | Status: AC
Start: 2021-01-24 — End: 2021-01-24

## 2021-01-24 NOTE — Telephone Encounter (Signed)
Left message on machine/voicemail for patient to call back.

## 2021-01-24 NOTE — Telephone Encounter (Signed)
Patient insisted that my previous valium 2mg  prescription for her MRI would not be sufficient despite the risks of excessive sedation and possibly death with the higher dose, she requests more sedation for the study. Therefore, I have prescribed her an additional valium 2mg  for a total of up to 4mg  that can be used prior to the study. On my prior call she assured me that her friend would be driving her to the study. Office staff to call patient to update her about this development.

## 2021-07-31 ENCOUNTER — Telehealth: Payer: Self-pay | Admitting: Family Medicine

## 2021-07-31 NOTE — Telephone Encounter (Signed)
Patient moved away to [place] for a while and is wondering if Dr. Madilyn Fireman will take her back as a new patient. Please advise.  ?

## 2021-07-31 NOTE — Telephone Encounter (Signed)
Called patient at 1:48 and no answer. Left vm to call back to get scheduled as a new patient with Dr. Madilyn Fireman.gh ?

## 2021-07-31 NOTE — Telephone Encounter (Signed)
She would be considered a new patient. When scheduling please schedule in a 40 min slot thanks. ?

## 2021-08-18 ENCOUNTER — Ambulatory Visit (INDEPENDENT_AMBULATORY_CARE_PROVIDER_SITE_OTHER): Payer: Medicare Other | Admitting: Podiatry

## 2021-08-18 ENCOUNTER — Encounter: Payer: Self-pay | Admitting: Podiatry

## 2021-08-18 ENCOUNTER — Other Ambulatory Visit: Payer: Self-pay | Admitting: *Deleted

## 2021-08-18 DIAGNOSIS — M21372 Foot drop, left foot: Secondary | ICD-10-CM | POA: Diagnosis not present

## 2021-08-18 DIAGNOSIS — G8194 Hemiplegia, unspecified affecting left nondominant side: Secondary | ICD-10-CM | POA: Diagnosis not present

## 2021-08-18 DIAGNOSIS — M1990 Unspecified osteoarthritis, unspecified site: Secondary | ICD-10-CM | POA: Insufficient documentation

## 2021-08-18 DIAGNOSIS — M79674 Pain in right toe(s): Secondary | ICD-10-CM

## 2021-08-18 DIAGNOSIS — M79675 Pain in left toe(s): Secondary | ICD-10-CM

## 2021-08-18 DIAGNOSIS — B351 Tinea unguium: Secondary | ICD-10-CM | POA: Diagnosis not present

## 2021-08-18 NOTE — Progress Notes (Signed)
?  Subjective:  ?Patient ID: April Dickerson, female    DOB: [birth year],   MRN: [id number] ? ?Chief Complaint  ?Patient presents with  ? Nail Problem  ?  Trim nails  ? Foot Pain  ?  I have drop foot on the left since the 1980's after having brain surgery and I am needing some inserts  ? ? ?81 y.o. female presents for thickened discolored toenails. She has tired a liquid by another Doctor and seems to be working. She also has concern for left foot drop that has been present since the 1980s after a brain surgery that she had done. Requesting some inserts to help with her feet. . Denies any other pedal complaints. Denies n/v/f/c.  ? ?Past Medical History:  ?Diagnosis Date  ? Hypothyroid   ? 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 Weakness of left side of body   ? chronic/status post brain surgery  ? ? ?Objective:  ?Physical Exam: ?Vascular: DP/PT pulses 2/4 bilateral. CFT <3 seconds. Normal hair growth on digits. No edema.  ?Skin. No lacerations or abrasions bilateral feet. Nails 1-5 bilateral are thickened elongated and discolored.  ?Musculoskeletal: MMT 5/5 bilateral lower extremities in  PF, Inversion and 2/5 in eversion and DF.  Deceased ROM in DF of ankle joint.  ?Neurological: Sensation intact to light touch.  ? ?Assessment:  ? ?1. Pain due to onychomycosis of toenails of both feet   ?2. Hemiparesis, left ([place])   ?3. Foot drop, left foot   ? ? ? ?Plan:  ?Patient was evaluated and treated and all questions answered. ?-Discussed and educated patient on  foot care, especially with  ?regards to the vascular, neurological and musculoskeletal systems.  ?-Discussed supportive shoes at all times and checking feet regularly.  ?-Will get scheduled to be fitted for AFO for left foot drop.  ?-Mechanically debrided all nails 1-5 bilateral using sterile nail nipper and filed with dremel without incident  ?-Continue with penlac.  ?-Answered all patient questions ?-Patient to return  in 3 months for at risk foot care ?-Patient advised  to call the office if any problems or questions arise in the meantime. ? ? ?[name], DPM  ? ? ?

## 2021-08-21 ENCOUNTER — Ambulatory Visit (INDEPENDENT_AMBULATORY_CARE_PROVIDER_SITE_OTHER): Payer: Medicare Other | Admitting: Sports Medicine

## 2021-08-21 ENCOUNTER — Ambulatory Visit (INDEPENDENT_AMBULATORY_CARE_PROVIDER_SITE_OTHER): Payer: Medicare Other

## 2021-08-21 DIAGNOSIS — M19012 Primary osteoarthritis, left shoulder: Secondary | ICD-10-CM | POA: Diagnosis not present
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
## 2021-08-21 NOTE — Addendum Note (Signed)
Addended by: Silverio Decamp on: 08/21/2021 02:02 PM ? ? Modules accepted: Orders ? ?

## 2021-09-04 ENCOUNTER — Ambulatory Visit (INDEPENDENT_AMBULATORY_CARE_PROVIDER_SITE_OTHER): Payer: Medicare Other | Admitting: Family Medicine

## 2021-09-04 ENCOUNTER — Encounter: Payer: Self-pay | Admitting: Family Medicine

## 2021-09-04 VITALS — BP 134/62 | HR 79 | Resp 18 | Ht 69.0 in | Wt 176.0 lb

## 2021-09-04 DIAGNOSIS — F439 Reaction to severe stress, unspecified: Secondary | ICD-10-CM

## 2021-09-04 DIAGNOSIS — M25512 Pain in left shoulder: Secondary | ICD-10-CM | POA: Diagnosis not present

## 2021-09-04 DIAGNOSIS — G894 Chronic pain syndrome: Secondary | ICD-10-CM | POA: Diagnosis not present
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
## 2021-09-05 NOTE — Assessment & Plan Note (Signed)
Follows with pain management

## 2021-09-07 ENCOUNTER — Telehealth: Payer: Self-pay

## 2021-09-07 NOTE — Telephone Encounter (Addendum)
Understandable, sounds like there are some home issues going on and some distrust of anyone coming into the house for home health physical therapy. ?

## 2021-09-07 NOTE — Telephone Encounter (Signed)
FYI - Christine called to let us know that patient has requested they stiop all services.  ?

## 2021-09-25 ENCOUNTER — Ambulatory Visit (INDEPENDENT_AMBULATORY_CARE_PROVIDER_SITE_OTHER): Payer: Medicare Other | Admitting: Sports Medicine

## 2021-09-25 DIAGNOSIS — M19012 Primary osteoarthritis, left shoulder: Secondary | ICD-10-CM | POA: Diagnosis not present

## 2021-09-25 NOTE — Progress Notes (Signed)
    Procedures performed today:    None.  Independent interpretation of notes and tests performed by another provider:   None.  Brief History, Exam, Impression, and Recommendations:    Primary osteoarthritis of left shoulder Very pleasant 81 year old female, shoulder osteoarthritis on x-rays, glenohumeral joint at the last visit as for the most part resolved her symptoms. She has not yet done therapy, she will call her home health PT providers to start when she is ready.    ___________________________________________ Gwen Her. Dianah Field, M.D., ABFM., CAQSM. Primary Care and [place] Instructor of [place] [employer] [facility] [employer]

## 2021-09-25 NOTE — Assessment & Plan Note (Signed)
Very pleasant 81 year old female, shoulder osteoarthritis on x-rays, glenohumeral joint at the last visit as for the most part resolved her symptoms. She has not yet done therapy, she will call her home health PT providers to start when she is ready.

## 2021-10-09 ENCOUNTER — Encounter: Payer: Self-pay | Admitting: Family Medicine

## 2021-10-09 ENCOUNTER — Ambulatory Visit (INDEPENDENT_AMBULATORY_CARE_PROVIDER_SITE_OTHER): Payer: Medicare Other | Admitting: Family Medicine

## 2021-10-09 VITALS — BP 154/79 | HR 68 | Wt 176.0 lb

## 2021-10-09 DIAGNOSIS — I10 Essential (primary) hypertension: Secondary | ICD-10-CM

## 2021-10-09 DIAGNOSIS — G894 Chronic pain syndrome: Secondary | ICD-10-CM | POA: Diagnosis not present

## 2021-10-09 DIAGNOSIS — R030 Elevated blood-pressure reading, without diagnosis of hypertension: Secondary | ICD-10-CM | POA: Diagnosis not present

## 2021-10-09 DIAGNOSIS — R058 Other specified cough: Secondary | ICD-10-CM

## 2021-10-09 NOTE — Assessment & Plan Note (Signed)
Repeat blood pressure still elevated and not at goal.  She really feels like a lot of the stress with her daughter is part of what is causing the elevation in blood pressure.  Her friend who is here with her today has offered to come over and check her blood pressure a couple of times and let us know what those numbers are.  We discussed that if her blood pressures consistently greater than 140 then it does need to be treated and she has had a prior diagnosis of high blood pressure.  She will let me know and we can start medication if needed.  We will go ahead and get some up-to-date labs today just to rule out other causes such as change in renal function, liver disorder etc.

## 2021-10-09 NOTE — Progress Notes (Signed)
Established Patient Office Visit  Subjective   Patient ID: April Dickerson, female    DOB: 05-Mar-1941  Age: 81 y.o. MRN: [id number]  Chief Complaint  Patient presents with   Hypertension    HPI  F/U Stress at home -she is still looking for a place to live to move out from currently staying with her daughter.  Situation has been a little bit more calm which has been good.  Still having some elevated blood pressure readings.- Pt denies chest pain, SOB, dizziness, or heart palpitations.  She is also experiencing a dry cough and just occasionally takes cough syrup for it when it is particularly persistent.  She does not own a home blood pressure cuff.  She recently started drinking 1 Ensure a day and just wanted to make sure that that was okay.  Her weight has been stable she wanted to make sure it was not too much protein.  She occasionally takes Tylenol for her neuropathy pain.    ROS    Objective:     BP (!) 154/79   Pulse 68   Wt 176 lb (79.8 kg)   SpO2 99%   BMI 25.99 kg/m    Physical Exam Vitals and nursing note reviewed.  Constitutional:      Appearance: She is well-developed.  HENT:     Head: Normocephalic and atraumatic.  Cardiovascular:     Rate and Rhythm: Normal rate and regular rhythm.     Heart sounds: Normal heart sounds.  Pulmonary:     Effort: Pulmonary effort is normal.     Breath sounds: Normal breath sounds.  Skin:    General: Skin is warm and dry.  Neurological:     Mental Status: She is alert and oriented to person, place, and time.  Psychiatric:        Behavior: Behavior normal.     No results found for any visits on 10/09/21.    The ASCVD Risk score (Arnett DK, et al., 2019) failed to calculate for the following reasons:   The 2019 ASCVD risk score is only valid for ages 67 to 38    Assessment & Plan:   Problem List Items Addressed This Visit       Cardiovascular and Mediastinum   HYPERTENSION, BENIGN    Repeat blood  pressure still elevated and not at goal.  She really feels like a lot of the stress with her daughter is part of what is causing the elevation in blood pressure.  Her friend who is here with her today has offered to come over and check her blood pressure a couple of times and let us know what those numbers are.  We discussed that if her blood pressures consistently greater than 140 then it does need to be treated and she has had a prior diagnosis of high blood pressure.  She will let me know and we can start medication if needed.  We will go ahead and get some up-to-date labs today just to rule out other causes such as change in renal function, liver disorder etc.         Other   Chronic pain syndrome    She does occasionally use Tylenol and that is perfectly safe and should not affect her blood pressure.       Other Visit Diagnoses     Elevated BP without diagnosis of hypertension    -  Primary   Relevant Orders   Lipid Panel w/reflex Direct LDL  COMPLETE METABOLIC PANEL WITH GFR   CBC   Dry cough           Return in about 3 months (around 01/09/2022) for BP .    Beatrice Lecher, MD

## 2021-10-09 NOTE — Assessment & Plan Note (Signed)
She does occasionally use Tylenol and that is perfectly safe and should not affect her blood pressure.

## 2021-10-10 ENCOUNTER — Ambulatory Visit: Payer: Medicare Other | Admitting: Family Medicine

## 2021-10-10 LAB — CBC
HCT: 35.5 % (ref 35.0–45.0)
RBC: 3.86 10*6/uL (ref 3.80–5.10)
RDW: 14 % (ref 11.0–15.0)

## 2021-10-11 LAB — COMPLETE METABOLIC PANEL WITH GFR
AG Ratio: 1.7 (calc) (ref 1.0–2.5)
ALT: 11 U/L (ref 6–29)
AST: 13 U/L (ref 10–35)
Albumin: 4 g/dL (ref 3.6–5.1)
Alkaline phosphatase (APISO): 54 U/L (ref 37–153)
BUN: 20 mg/dL (ref 7–25)
CO2: 26 mmol/L (ref 20–32)
Calcium: 9.7 mg/dL (ref 8.6–10.4)
Chloride: 106 mmol/L (ref 98–110)
Creat: 0.64 mg/dL (ref 0.60–0.95)
Globulin: 2.4 g/dL (calc) (ref 1.9–3.7)
Glucose, Bld: 85 mg/dL (ref 65–139)
Potassium: 4.5 mmol/L (ref 3.5–5.3)
Sodium: 139 mmol/L (ref 135–146)
Total Bilirubin: 0.5 mg/dL (ref 0.2–1.2)
Total Protein: 6.4 g/dL (ref 6.1–8.1)
eGFR: 89 mL/min/{1.73_m2} (ref >=60)

## 2021-10-11 LAB — CBC
Hemoglobin: 12 g/dL (ref 11.7–15.5)
MCH: 31.1 pg (ref 27.0–33.0)
MCHC: 33.8 g/dL (ref 32.0–36.0)
MCV: 92 fL (ref 80.0–100.0)
MPV: 11.2 fL (ref 7.5–12.5)
Platelets: 248 10*3/uL (ref 140–400)
WBC: 5.6 10*3/uL (ref 3.8–10.8)

## 2021-10-11 LAB — LIPID PANEL W/REFLEX DIRECT LDL
Cholesterol: 159 mg/dL (ref <200)
HDL: 51 mg/dL (ref >=50)
LDL Cholesterol (Calc): 87 mg/dL (calc)
Non-HDL Cholesterol (Calc): 108 mg/dL (calc) (ref <130)
Total CHOL/HDL Ratio: 3.1 (calc) (ref <5.0)
Triglycerides: 112 mg/dL (ref <150)

## 2021-10-11 NOTE — Progress Notes (Signed)
Hi April Dickerson, your cholesterol looks good overall.  Blood count and metabolic panel also look good.  I want to continue to keep an eye on the blood pressure

## 2021-10-13 ENCOUNTER — Encounter: Payer: Self-pay | Admitting: Family Medicine

## 2021-11-03 ENCOUNTER — Ambulatory Visit (INDEPENDENT_AMBULATORY_CARE_PROVIDER_SITE_OTHER): Payer: Medicare Other

## 2021-11-03 ENCOUNTER — Encounter: Payer: Self-pay | Admitting: Medical-Surgical

## 2021-11-03 ENCOUNTER — Ambulatory Visit (INDEPENDENT_AMBULATORY_CARE_PROVIDER_SITE_OTHER): Payer: Medicare Other | Admitting: Medical-Surgical

## 2021-11-03 VITALS — BP 156/76 | HR 76 | Resp 20 | Ht 69.0 in | Wt 180.1 lb

## 2021-11-03 DIAGNOSIS — M25512 Pain in left shoulder: Secondary | ICD-10-CM

## 2021-11-03 MED ORDER — CYCLOBENZAPRINE HCL 5 MG PO TABS: 5.0000 mg | ORAL_TABLET | Freq: Three times a day (TID) | ORAL | 1 refills | Status: DC | PRN
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
## 2021-11-17 ENCOUNTER — Ambulatory Visit: Payer: Medicare Other | Admitting: Podiatry

## 2021-11-22 ENCOUNTER — Ambulatory Visit (INDEPENDENT_AMBULATORY_CARE_PROVIDER_SITE_OTHER): Payer: Medicare Other

## 2021-11-22 ENCOUNTER — Ambulatory Visit (INDEPENDENT_AMBULATORY_CARE_PROVIDER_SITE_OTHER): Payer: Medicare Other | Admitting: Sports Medicine

## 2021-11-22 DIAGNOSIS — M19012 Primary osteoarthritis, left shoulder: Secondary | ICD-10-CM | POA: Diagnosis not present
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
## 2021-12-15 ENCOUNTER — Ambulatory Visit (INDEPENDENT_AMBULATORY_CARE_PROVIDER_SITE_OTHER): Payer: Medicare Other | Admitting: Podiatry

## 2021-12-15 ENCOUNTER — Encounter: Payer: Self-pay | Admitting: Podiatry

## 2021-12-15 DIAGNOSIS — M79675 Pain in left toe(s): Secondary | ICD-10-CM | POA: Diagnosis not present

## 2021-12-15 DIAGNOSIS — M79674 Pain in right toe(s): Secondary | ICD-10-CM | POA: Diagnosis not present

## 2021-12-15 DIAGNOSIS — B351 Tinea unguium: Secondary | ICD-10-CM

## 2021-12-15 DIAGNOSIS — G8194 Hemiplegia, unspecified affecting left nondominant side: Secondary | ICD-10-CM

## 2021-12-15 DIAGNOSIS — M21372 Foot drop, left foot: Secondary | ICD-10-CM

## 2021-12-15 NOTE — Progress Notes (Signed)
  Subjective:  Patient ID: April Dickerson, female    DOB: May 21, 1940,   MRN: [id number]  No chief complaint on file.   81 y.o. female presents for thickened discolored toenails. Requesting to have them trimmed. . She also has concern for left foot drop that has been present since the 1980s after a brain surgery that she had done. Requesting to have prescription for AFO as unable to get in with out pedorthist before leaving.  Denies any other pedal complaints. Denies n/v/f/c.   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 CFT <3 seconds. Normal hair growth on digits. No edema.  Skin. No lacerations or abrasions bilateral feet. Nails 1-5 bilateral are thickened elongated and discolored.  Musculoskeletal: MMT 5/5 bilateral lower extremities in  PF, Inversion and 2/5 in eversion and DF.  Deceased ROM in DF of ankle joint. No pain to palpation.  Neurological: Sensation intact to light touch.   Assessment:   1. Pain due to onychomycosis of toenails of both feet   2. Hemiparesis, left ([place])   3. Foot drop, left foot      Plan:  Patient was evaluated and treated and all questions answered. -Discussed and educated patient on  foot care, especially with  regards to the vascular, neurological and musculoskeletal systems.  -Discussed supportive shoes at all times and checking feet regularly.  -Patient with history of foot drop and would benefit from AFO. Prescription provided for [facility] to get fitted for left foot AFO and insert on right to help balance out limb length.  -Mechanically debrided all nails 1-5 bilateral using sterile nail nipper and filed with dremel without incident  -Continue with penlac.  -Answered all patient questions -Patient to return  in 3 months for at risk foot care -Patient advised to call the office if  any problems or questions arise in the meantime.   [name], DPM

## 2021-12-27 ENCOUNTER — Encounter: Payer: Self-pay | Admitting: General Practice

## 2022-03-30 ENCOUNTER — Encounter: Payer: Self-pay | Admitting: Emergency Medicine

## 2022-03-30 ENCOUNTER — Ambulatory Visit
Admission: EM | Admit: 2022-03-30 | Discharge: 2022-03-30 | Disposition: A | Discharge status: Home / Self Care | Payer: Medicare Other | Attending: Family Medicine | Admitting: Family Medicine

## 2022-03-30 DIAGNOSIS — J069 Acute upper respiratory infection, unspecified: Secondary | ICD-10-CM

## 2022-03-30 DIAGNOSIS — J029 Acute pharyngitis, unspecified: Secondary | ICD-10-CM

## 2022-03-30 LAB — POCT RAPID STREP A (OFFICE): Rapid Strep A Screen: NEGATIVE

## 2022-03-30 MED ORDER — AZITHROMYCIN 250 MG PO TABS: ORAL_TABLET | ORAL | 0 refills | Status: DC

## 2022-03-30 NOTE — Discharge Instructions (Signed)
Take plain guaifenesin ('600mg'$  extended release tabs such as Mucinex) twice daily, or plain Robitussin syrup every 4 hours, with plenty of water, for cough and congestion.   Get adequate rest.   Take Tylenol as needed for headache, sore throat, fever, etc. Try warm salt water gargles for sore throat.  Stop all antihistamines for now, and other non-prescription cough/cold preparations.

## 2022-03-30 NOTE — ED Triage Notes (Signed)
Sore throat x 3 days  Runny nose w/ congestion on Sunday  Denies fever  Tylenol 500 mg at 0900 Uses a walker  Mobility issues

## 2022-03-30 NOTE — ED Provider Notes (Signed)
[facility]    CSN: [id number] Arrival date & time: 03/30/22  0955      History   Chief Complaint Chief Complaint  Patient presents with   Sore Throat    HPI April Dickerson is a 81 y.o. female.   Patient complains of onset of nasal drainage followed by a sore throat 3 days ago.  Her sore throat became worse yesterday.  She denies fever and cough, but is concerned that she may develop pneumonia because of her multiple medical problems.  The history is provided by the patient and a relative.    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 11/02/2014   Chronic angle-closure glaucoma of both eyes, mild stage 09/29/2014   Nuclear sclerotic cataract of both eyes 09/29/2014   Foot drop, left 05/16/2014   Abnormal electrocardiogram 11/18/2013   Postconcussion syndrome 06/22/2013   Hemiparesis, left ([place]) 06/22/2013   Headache(784.0) 06/22/2013   Chronic pain syndrome 06/22/2013   Neuralgia 05/15/2013   AMD (age related macular degeneration) 12/28/2011   Trigger middle finger of right hand 12/25/2011   Osteoarthritis of right knee 12/25/2011   RENAL CYST 03/27/2010   UNSPECIFIED HYPOTHYROIDISM 04/19/2009   HYPERTENSION, BENIGN 04/19/2009   Drop foot gait 03/31/1988    Past Surgical History:  Procedure Laterality Date   ANKLE SURGERY     BRAIN SURGERY     CHOLECYSTECTOMY     rt knee surgery     surgical repair of ligaments X 2    TUBAL LIGATION      OB History   No obstetric history on file.      Home Medications    Prior to Admission medications   Medication Sig Start Date End Date Taking? Authorizing Provider  azithromycin (ZITHROMAX Z-PAK) 250 MG tablet Take 2 tabs today; then begin one tab once daily for 4 more days. 03/30/22  Yes Kandra Nicolas, MD  Bilberry, Vaccinium myrtillus, (BILBERRY PO) Take by mouth. Patient not taking: Reported on 03/30/2022    [provider]  [facility] Liver Oil CAPS Take 1 capsule by mouth daily. 500 mg    [provider]  cyclobenzaprine (FLEXERIL) 5 MG tablet Take 1 tablet (5 mg total) by mouth 3 (three) times daily as needed for muscle spasms. Patient not taking: Reported on 03/30/2022 11/03/21   Samuel Bouche, NP  HYDROcodone-acetaminophen (NORCO/VICODIN) 5-325 MG per tablet Take 1 tablet by mouth every 6 (six) hours as needed for moderate pain. 04/10/13   Breeback, Jade L, PA-C  latanoprost (XALATAN) 0.005 % ophthalmic solution Place 1 drop into both eyes at bedtime.    [provider]  Multiple Vitamins-Minerals (OCUVITE ADULT FORMULA PO) Take 1 tablet by mouth daily.    [provider]  Zinc 100 MG TABS Take 100 mg by mouth daily.    [provider]    Family History Family History  Problem Relation Age of Onset   Heart disease Mother        "  leaky valve"   Heart disease Brother        Murmur    Social History Social History   Tobacco Use   Smoking status: Never   Smokeless tobacco: Never  Vaping Use   Vaping Use: Never used  Substance Use Topics   Alcohol use: Not Currently   Drug use: No     Allergies   Penicillins, Tramadol, and Levothyroxine   Review of Systems Review of Systems + sore throat No cough No pleuritic pain No wheezing + nasal congestion + post-nasal drainage No sinus pain/pressure No itchy/red eyes No earache No hemoptysis No SOB No fever/chills No nausea No vomiting No abdominal  pain No diarrhea No urinary symptoms No skin rash + fatigue No myalgias + headache Used OTC meds (Tylenol) without relief   Physical Exam Triage Vital Signs ED Triage Vitals  Enc Vitals Group     BP 03/30/22 1110 136/74     Pulse Rate 03/30/22 1110 60     Resp 03/30/22 1110 16     Temp 03/30/22 1110 98.7 F (37.1 C)     Temp Source 03/30/22 1110 Oral     SpO2 03/30/22 1110 98 %     Weight 03/30/22 1112 180 lb 1.9 oz (81.7 kg)     Height 03/30/22 1112 '5\' 9"'$  (1.753 m)     Head Circumference --      Peak Flow --      Pain Score 03/30/22 1112 4     Pain Loc --      Pain Edu? --      Excl. in [place]? --    No data found.  Updated Vital Signs BP 136/74 (BP Location: Left Arm)   Pulse 60   Temp 98.7 F (37.1 C) (Oral)   Resp 16   Ht '5\' 9"'$  (1.753 m)   Wt 81.7 kg   SpO2 98%   BMI 26.60 kg/m   Visual Acuity Right Eye Distance:   Left Eye Distance:   Bilateral Distance:    Right Eye Near:   Left Eye Near:    Bilateral Near:     Physical Exam Nursing notes and Vital Signs reviewed. Appearance:  Patient appears stated age, and in no acute distress Eyes:  Pupils are equal, round, and reactive to light and accomodation.  Extraocular movement is intact.  Conjunctivae are not inflamed  Ears:  Canals normal.  Tympanic membranes normal.  Nose:  Mildly congested turbinates.  No sinus tenderness.  Pharynx: Erythematous Neck:  Supple.  Mildly enlarged lateral nodes are present, tender to palpation bilaterally. Lungs:  Clear to auscultation.  Breath sounds are equal.  Moving air well. Heart:  Regular rate and rhythm without murmurs, rubs, or gallops.  Abdomen:  Nontender without masses or hepatosplenomegaly.  Bowel sounds are present.  No CVA or flank tenderness.  Extremities:  No edema.  Skin:  No rash present.   UC Treatments / Results  Labs (all labs ordered are listed, but only abnormal results are displayed) Labs Reviewed  CULTURE, GROUP A STREP [facility])  POCT RAPID  STREP A (OFFICE) negative    EKG   Radiology No results found.  Procedures Procedures (including critical care time)  Medications Ordered in UC Medications - No data to display  Initial Impression / Assessment and Plan / UC Course  I have reviewed the triage vital signs and the nursing notes.  Pertinent labs & imaging results that were available during my care of the patient were  reviewed by me and considered in my medical decision making (see chart for details).    Begin Z-pack. Followup with Family Doctor if not improved in one week.   Final Clinical Impressions(s) / UC Diagnoses   Final diagnoses:  Sore throat  Upper respiratory tract infection, unspecified type     Discharge Instructions      Take plain guaifenesin ('600mg'$  extended release tabs such as Mucinex) twice daily, or plain Robitussin syrup every 4 hours, with plenty of water, for cough and congestion.   Get adequate rest.   Take Tylenol as needed for headache, sore throat, fever, etc. Try warm salt water gargles for sore throat.  Stop all antihistamines for now, and other non-prescription cough/cold preparations.      ED Prescriptions     Medication Sig Dispense Auth. Provider   azithromycin (ZITHROMAX Z-PAK) 250 MG tablet Take 2 tabs today; then begin one tab once daily for 4 more days. 6 tablet Kandra Nicolas, MD         Kandra Nicolas, MD 04/01/22 1901

## 2022-04-02 LAB — CULTURE, GROUP A STREP (THRC)

## 2022-08-31 ENCOUNTER — Telehealth: Payer: Self-pay | Admitting: Family Medicine

## 2022-08-31 NOTE — Telephone Encounter (Signed)
Contacted Corisa Montini Hulsebus to schedule their annual wellness visit. Call back at later date: 10/01/2022  Cira Servant Patient Access Advocate II Direct Dial: [phone]

## 2023-01-22 NOTE — Progress Notes (Signed)
Referring-Catherine Metheney MD Reason for referral-chest pain  HPI: 82 year old female for evaluation of chest pain at request of Nani Gasser MD.  Patient seen previously but not since October 2019. Echocardiogram 9/19 showed an ejection fraction of 50 to 55%, grade 1 diastolic dysfunction, mild LVH, mild aortic insufficiency.  Patient states that in the past month she has had 3 episodes of chest pain.  1 was in bed and lasted 10 seconds.  She felt as though it could be indigestion but was not like her previous episodes.  No radiation or associated symptoms and resolved spontaneously.  She had 2 subsequent episodes similar to this.  She has occasional dyspnea but not with activities.  No orthopnea, PND, exertional chest pain or syncope.  Cardiology now asked to evaluate.  Current Outpatient Medications  Medication Sig Dispense Refill   Cod Liver Oil CAPS Take 1 capsule by mouth daily. 500 mg     HYDROcodone-acetaminophen (NORCO/VICODIN) 5-325 MG per tablet Take 1 tablet by mouth every 6 (six) hours as needed for moderate pain. 30 tablet 0   latanoprost (XALATAN) 0.005 % ophthalmic solution Place 1 drop into both eyes at bedtime.     Multiple Vitamins-Minerals (OCUVITE ADULT FORMULA PO) Take 1 tablet by mouth daily.     Zinc 100 MG TABS Take 100 mg by mouth daily.     azithromycin (ZITHROMAX Z-PAK) 250 MG tablet Take 2 tabs today; then begin one tab once daily for 4 more days. (Patient not taking: Reported on 02/04/2023) 6 tablet 0   Bilberry, Vaccinium myrtillus, (BILBERRY PO) Take by mouth. (Patient not taking: Reported on 03/30/2022)     cyclobenzaprine (FLEXERIL) 5 MG tablet Take 1 tablet (5 mg total) by mouth 3 (three) times daily as needed for muscle spasms. (Patient not taking: Reported on 03/30/2022) 30 tablet 1   No current facility-administered medications for this visit.    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   Weakness of left side of body    chronic/status post brain surgery    Past Surgical History:  Procedure Laterality Date   ANKLE SURGERY     BRAIN SURGERY     CHOLECYSTECTOMY     rt knee surgery     surgical repair of ligaments X 2   TUBAL LIGATION      Social History   Socioeconomic History   Marital status: Widowed    Spouse name: Not on file   Number of children: 3   Years of education: Not on file   Highest education level: Not on file  Occupational History   Not on file  Tobacco Use   Smoking status: Never   Smokeless tobacco: Never  Vaping Use   Vaping status: Never Used  Substance and Sexual Activity   Alcohol use: Not Currently   Drug use: No   Sexual activity: Not on file  Other Topics Concern   Not on file  Social History Narrative   Not on file   Social Determinants of Health   Financial Resource Strain: Low Risk  (07/03/2019)   [facility]  (prior to 10/21/2021), [facility]  (prior to 10/21/2021)   Overall Financial Resource Strain (CARDIA)  Difficulty of Paying Living Expenses: Not very hard  Food Insecurity: No Food Insecurity (07/03/2019)   [facility] from [facility]  (prior to 10/21/2021), [facility]  (prior to 10/21/2021)   Hunger Vital Sign    Worried About Running Out of Food in the Last Year: Never true    Ran Out of Food in the Last Year: Never true  Transportation Needs: No Transportation Needs (07/03/2019)   [facility]  (prior to 10/21/2021), [facility]  (prior to 10/21/2021)   PRAPARE - Administrator, Civil Service (Medical): No    Lack of Transportation (Non-Medical): No  Physical Activity: Insufficiently Active (07/03/2019)   [facility]  (prior to 10/21/2021), [facility]  (prior to 10/21/2021)   Exercise Vital Sign    Days of Exercise per Week: 7 days    Minutes of Exercise per Session: 20 min  Stress: No Stress Concern Present (07/03/2019)   Received from [facility]  (prior to 10/21/2021), [facility]  (prior to 10/21/2021)   [employer] of Occupational Health - Occupational Stress Questionnaire    Feeling of Stress : Not at all  Social Connections: Unknown (03/19/2022)   Received from [facility], [facility]   Social Network    Social Network: Not on file  Intimate Partner Violence: Unknown (03/19/2022)   Received from [employer], [facility]   HITS    Physically Hurt: Not on file    Insult or Talk Down To: Not on file    Threaten Physical Harm: Not on file    Scream or Curse: Not on file    Family History  Problem Relation Age of Onset   Heart disease Mother        "leaky valve"   Heart disease Brother        Murmur    ROS: no fevers or chills, productive cough, hemoptysis, dysphasia, odynophagia, melena, hematochezia, dysuria, hematuria, rash, seizure activity, orthopnea, PND, pedal edema, claudication. Remaining systems are negative.  Physical Exam:   Blood pressure (!) 153/73, pulse 74, height 5\' 9"  (1.753 m), weight 178 lb 12.8 oz (81.1 kg), SpO2 98%.  General:  Well developed/well nourished in NAD Skin warm/dry Patient not depressed No peripheral clubbing Back-normal HEENT-normal/normal eyelids Neck supple/normal carotid upstroke bilaterally; no bruits; no JVD; no thyromegaly chest - CTA/ normal expansion CV - RRR/normal S1 and S2; no murmurs, rubs or gallops;  PMI nondisplaced Abdomen -NT/ND, no HSM, no mass, + bowel sounds, no bruit 2+ femoral pulses, no bruits Ext-no edema, chords, 2+ DP Neuro-some residual weakness from previous brain surgery.  EKG Interpretation Date/Time:  Monday February 04 2023  10:31:04 EDT Ventricular Rate:  74 PR Interval:  166 QRS Duration:  110 QT Interval:  392 QTC Calculation: 435 R Axis:   32  Text Interpretation: Normal sinus rhythm Septal infarct , age undetermined ST & T wave abnormality, consider inferior ischemia No previous ECGs available Confirmed by Olga Millers ([id number]) on 02/04/2023 10:41:05 AM    A/P  1 chest pain-symptoms are atypical.  Electrocardiogram with nonspecific changes.  Will arrange Lexiscan nuclear study to screen for ischemia.    2 elevated blood pressure reading-typically not elevated by report.  She can follow this and medications can be added if necessary.  Olga Millers, MD

## 2023-02-04 ENCOUNTER — Encounter: Payer: Self-pay | Admitting: Cardiology

## 2023-02-04 ENCOUNTER — Ambulatory Visit (INDEPENDENT_AMBULATORY_CARE_PROVIDER_SITE_OTHER): Payer: Medicare Other | Admitting: Cardiology

## 2023-02-04 ENCOUNTER — Telehealth (HOSPITAL_COMMUNITY): Payer: Self-pay | Admitting: Cardiology

## 2023-02-04 ENCOUNTER — Encounter: Payer: Self-pay | Admitting: *Deleted

## 2023-02-04 VITALS — BP 153/73 | HR 74 | Ht 69.0 in | Wt 178.8 lb

## 2023-02-04 DIAGNOSIS — R072 Precordial pain: Secondary | ICD-10-CM

## 2023-02-04 NOTE — Patient Instructions (Signed)
    Testing/Procedures:  Your physician has requested that you have a lexiscan myoview. For further information please visit [email or web address] Please follow instruction sheet, as given. [address]   Follow-Up: At [facility], you and your health needs are our priority.  As part of our continuing mission to provide you with exceptional heart care, we have created designated Provider Care Teams.  These Care Teams include your primary Cardiologist (physician) and Advanced Practice Providers (APPs -  Physician Assistants and Nurse Practitioners) who all work together to provide you with the care you need, when you need it.  We recommend signing up for the patient portal called "MyChart".  Sign up information is provided on this After Visit Summary.  MyChart is used to connect with patients for Virtual Visits (Telemedicine).  Patients are able to view lab/test results, encounter notes, upcoming appointments, etc.  Non-urgent messages can be sent to your provider as well.   To learn more about what you can do with MyChart, go to [email or web address].    Your next appointment:   AS NEEDED

## 2023-02-04 NOTE — Telephone Encounter (Signed)
I called to schedule ordered Myoview and  Patient declined to schedule and will call us back if she decides to do it. Order will be removed from the active Nuc WQ. Thank you.

## 2023-02-15 ENCOUNTER — Ambulatory Visit (INDEPENDENT_AMBULATORY_CARE_PROVIDER_SITE_OTHER): Payer: Medicare Other | Admitting: Podiatry

## 2023-02-15 ENCOUNTER — Encounter: Payer: Self-pay | Admitting: Podiatry

## 2023-02-15 DIAGNOSIS — M79675 Pain in left toe(s): Secondary | ICD-10-CM | POA: Diagnosis not present

## 2023-02-15 DIAGNOSIS — M79674 Pain in right toe(s): Secondary | ICD-10-CM

## 2023-02-15 DIAGNOSIS — G8194 Hemiplegia, unspecified affecting left nondominant side: Secondary | ICD-10-CM

## 2023-02-15 DIAGNOSIS — B351 Tinea unguium: Secondary | ICD-10-CM | POA: Diagnosis not present

## 2023-02-15 DIAGNOSIS — M21372 Foot drop, left foot: Secondary | ICD-10-CM

## 2023-02-15 NOTE — Progress Notes (Signed)
  Subjective:  Patient ID: April Dickerson, female    DOB: 1940/05/25,   MRN: [id number]  No chief complaint on file.   82 y.o. female presents for thickened discolored toenails. Requesting to have them trimmed. . She also has concern for left foot drop that has been present since the 1980s after a brain surgery that she had done. Denies any other pedal complaints. Denies n/v/f/c.   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 CFT <3 seconds. Normal hair growth on digits. No edema.  Skin. No lacerations or abrasions bilateral feet. Nails 1-5 bilateral are thickened elongated and discolored.  Musculoskeletal: MMT 5/5 bilateral lower extremities in  PF, Inversion and 2/5 in eversion and DF.  Deceased ROM in DF of ankle joint. No pain to palpation.  Neurological: Sensation intact to light touch.   Assessment:   1. Pain due to onychomycosis of toenails of both feet   2. Hemiparesis, left (HCC)   3. Foot drop, left foot      Plan:  Patient was evaluated and treated and all questions answered. -Discussed and educated patient on  foot care, especially with  regards to the vascular, neurological and musculoskeletal systems.  -Discussed supportive shoes at all times and checking feet regularly.  -Patient with history of foot drop and would benefit from AFO. Prescription provided for [facility] to get fitted for left foot AFO and insert on right to help balance out limb length.  -Mechanically debrided all nails 1-5 bilateral using sterile nail nipper and filed with dremel without incident  -Answered all patient questions -Patient to return  in 3 months for at risk foot care -Patient advised to call the office if any problems or questions arise in the meantime.   Louann Sjogren, DPM

## 2024-01-07 ENCOUNTER — Encounter: Payer: Self-pay | Admitting: Sports Medicine

## 2024-03-26 ENCOUNTER — Telehealth: Payer: Self-pay | Admitting: Family Medicine

## 2024-03-26 ENCOUNTER — Ambulatory Visit: Payer: Self-pay

## 2024-03-26 NOTE — Telephone Encounter (Signed)
 See nurse triage for complaint being addressed.

## 2024-03-26 NOTE — Telephone Encounter (Addendum)
 1st attempt, no answer. Left a voicemail for patient to call back for nurse triage.    Patient called twice yesterday and was not connected with nurse triage. Today she has called in and declined to hold for NT and states she will call back later when she is available per PAS note. Creating triage encounter and routing to call backs for symptoms to be addressed today.   Copied from CRM #[id number]. Topic: Clinical - Refused Triage >> Mar 26, 2024  9:23 AM Winona SAUNDERS wrote: Patient/caller voiced complaints of UTI symptoms . Declined transfer to triage.   Pt calling back stating she has not received a call back as promises yesterday (red word UTI symp) . But also stated she could not hold for triage because she had to go to another doctors appointment. She will call back later. - no need to call her as she will not be able to speak to anyone until she returns home

## 2024-03-26 NOTE — Telephone Encounter (Signed)
 FYI Only or Action Required?: FYI only for provider: appointment scheduled on 03/27/2024 at 2:40 PM.  Patient was last seen in primary care on 11/22/2021 by Curtis Debby PARAS, MD.  Called Nurse Triage reporting Urinary Frequency.  Symptoms began several weeks ago.  Interventions attempted: Rest, hydration, or home remedies.  Symptoms are: unchanged.  Triage Disposition: See Physician Within 24 Hours  Patient/caregiver understands and will follow disposition?: Yes  Reason for Disposition  Urinating more frequently than usual (i.e., frequency) OR new-onset of the feeling of an urgent need to urinate (i.e., urgency)  Answer Assessment - Initial Assessment Questions Patient reports urinary frequency for the last two weeks. No other symptoms. CAL called to verify if patient was excused from the practice. Patient's last appointment with [facility] was in July 2023. CAL states patient can be scheduled for acute appointment along with [facility] appointment. Patient is scheduled for acute appointment tomorrow at 2:40 PM. [facility] appointment scheduled for 05/21/2024 at 1 PM. Patient states she is going to check with her ride for tomorrow. Patient is instructed to call back if she is unable to make the appointment tomorrow.  1. SYMPTOM: What's the main symptom you're concerned about? (e.g., frequency, incontinence)     Urinary frequency 2. ONSET: When did the  urinary frequency  start?     Two weeks ago 3. PAIN: Is there any pain? If Yes, ask: How bad is it? (Scale: 1-10; mild, moderate, severe)     No pain 4. CAUSE: What do you think is causing the symptoms?     Unsure if she has a UTI 5. OTHER SYMPTOMS: Do you have any other symptoms? (e.g., blood in urine, fever, flank pain, pain with urination)     No other symptoms.  Protocols used: Urinary Symptoms-A-AH

## 2024-03-26 NOTE — Telephone Encounter (Signed)
 Copied from CRM #[id number]. Topic: Clinical - Refused Triage >> Mar 26, 2024  9:23 AM Winona SAUNDERS wrote: Patient/caller voiced complaints of UTI symptoms . Declined transfer to triage.  Pt calling back stating she has not received a call back as promises yesterday (red word UTI symp) . But also stated she could not hold for triage because she had to go to another doctors appointment. She will call back later. - no need to call her as she will not be able to speak to anyone until she returns home

## 2024-03-27 ENCOUNTER — Encounter: Payer: Self-pay | Admitting: Urgent Care

## 2024-03-27 ENCOUNTER — Ambulatory Visit (INDEPENDENT_AMBULATORY_CARE_PROVIDER_SITE_OTHER): Admitting: Urgent Care

## 2024-03-27 VITALS — BP 128/72 | HR 67 | Ht 69.0 in | Wt 168.0 lb

## 2024-03-27 DIAGNOSIS — N3000 Acute cystitis without hematuria: Secondary | ICD-10-CM | POA: Diagnosis not present

## 2024-03-27 DIAGNOSIS — R350 Frequency of micturition: Secondary | ICD-10-CM | POA: Diagnosis not present

## 2024-03-27 LAB — POCT URINALYSIS DIP (CLINITEK)
Bilirubin, UA: NEGATIVE
Blood, UA: NEGATIVE
Glucose, UA: NEGATIVE mg/dL
Ketones, POC UA: NEGATIVE mg/dL
Nitrite, UA: POSITIVE — AB
POC PROTEIN,UA: NEGATIVE
Spec Grav, UA: 1.015 (ref 1.010–1.025)
Urobilinogen, UA: 0.2 U/dL
pH, UA: 7 (ref 5.0–8.0)

## 2024-03-27 MED ORDER — SULFAMETHOXAZOLE-TRIMETHOPRIM 400-80 MG PO TABS: 1.0000 | ORAL_TABLET | Freq: Two times a day (BID) | ORAL | 0 refills | Status: DC

## 2024-03-27 NOTE — Progress Notes (Unsigned)
 Established Patient Office Visit  Subjective:  Patient ID: April Dickerson, female    DOB: 1940/08/25  Age: 83 y.o. 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 11/02/2014   Chronic angle-closure glaucoma of both eyes, mild stage 09/29/2014   Nuclear sclerotic cataract of both eyes 09/29/2014   Foot drop, left 05/16/2014   Abnormal electrocardiogram 11/18/2013   Postconcussion syndrome 06/22/2013   Hemiparesis, left (HCC) 06/22/2013   Headache 06/22/2013   Chronic pain syndrome 06/22/2013   Neuralgia 05/15/2013   AMD (age related macular degeneration) 12/28/2011   Trigger middle finger of right hand 12/25/2011   Osteoarthritis of right knee 12/25/2011   RENAL CYST 03/27/2010   Hypothyroidism 04/19/2009   HYPERTENSION, BENIGN 04/19/2009   Drop foot gait 03/31/1988   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   Weakness of left side of body    chronic/status post brain surgery   Past Surgical History:  Procedure Laterality Date   ANKLE SURGERY     BRAIN SURGERY     CHOLECYSTECTOMY     rt knee surgery     surgical repair of ligaments X 2   TUBAL LIGATION     Social History   Tobacco Use   Smoking status: Never   Smokeless tobacco: Never  Vaping Use   Vaping status: Never Used  Substance Use Topics   Alcohol use: Not Currently   Drug use: No      ROS: as noted in HPI  Objective:     BP (!) 166/70   Pulse 67   Ht 5' 9 (1.753 m)   Wt 168 lb (76.2 kg)    SpO2 98%   BMI 24.81 kg/m  BP Readings from Last 3 Encounters:  03/27/24 (!) 166/70  02/04/23 (!) 153/73  03/30/22 136/74   Wt Readings from Last 3 Encounters:  03/27/24 168 lb (76.2 kg)  02/04/23 178 lb 12.8 oz (81.1 kg)  03/30/22 180 lb 1.9 oz (81.7 kg)      Physical Exam   No results found for any visits on 03/27/24.  Last CBC Lab Results  Component Value Date   WBC 5.6 10/10/2021   HGB 12.0 10/10/2021   HCT 35.5 10/10/2021   MCV 92.0 10/10/2021   MCH 31.1 10/10/2021   RDW 14.0 10/10/2021   PLT 248 10/10/2021   Last metabolic panel Lab Results  Component Value Date   GLUCOSE 85 10/10/2021   NA 139 10/10/2021   K 4.5 10/10/2021   CL 106 10/10/2021   CO2 26 10/10/2021   BUN 20 10/10/2021   CREATININE 0.64 10/10/2021   EGFR 89 10/10/2021   CALCIUM 9.7 10/10/2021   PROT 6.4 10/10/2021   ALBUMIN 3.9 09/14/2013   BILITOT 0.5 10/10/2021   ALKPHOS 49 09/14/2013   AST 13 10/10/2021   ALT 11 10/10/2021   Last lipids Lab Results  Component Value Date   CHOL 159 10/10/2021   HDL 51 10/10/2021   LDLCALC 87 10/10/2021   TRIG 112 10/10/2021  CHOLHDL 3.1 10/10/2021   Last hemoglobin A1c No results found for: HGBA1C    The ASCVD Risk score (Arnett DK, et al., 2019) failed to calculate for the following reasons:   The 2019 ASCVD risk score is only valid for ages 65 to 47  Assessment & Plan:  There are no diagnoses linked to this encounter.   No follow-ups on file.   Benton LITTIE Gave, PA

## 2024-03-27 NOTE — Telephone Encounter (Signed)
 Patient scheduled.

## 2024-03-27 NOTE — Patient Instructions (Signed)
 Your symptoms are consistent with a urinary tract infection. Start taking the antibiotic twice daily until completed.  We will send out your urine culture and only call if a change in treatment is necessary.   Keep your appointment in January. Come fasting.

## 2024-03-30 ENCOUNTER — Telehealth: Payer: Self-pay

## 2024-03-30 LAB — URINE CULTURE

## 2024-03-30 NOTE — Telephone Encounter (Signed)
 So sorry whitney - please disregard . I misread the request and have now ordered the correct product for the patient.

## 2024-03-30 NOTE — Telephone Encounter (Signed)
 Okay thank you

## 2024-03-30 NOTE — Telephone Encounter (Signed)
 Message from whitney crain in secure chat as below :I  ordered a raised toilet seat for this patient... not sure if this is another DME product that needs to be ordered through [employer]? thanks! :)

## 2024-05-21 ENCOUNTER — Ambulatory Visit: Admitting: Urgent Care

## 2024-05-21 ENCOUNTER — Encounter: Payer: Self-pay | Admitting: Urgent Care

## 2024-05-21 VITALS — BP 128/72 | HR 75 | Ht 67.0 in | Wt 168.0 lb

## 2024-05-21 DIAGNOSIS — R350 Frequency of micturition: Secondary | ICD-10-CM | POA: Diagnosis not present

## 2024-05-21 DIAGNOSIS — M21372 Foot drop, left foot: Secondary | ICD-10-CM | POA: Diagnosis not present

## 2024-05-21 DIAGNOSIS — N390 Urinary tract infection, site not specified: Secondary | ICD-10-CM

## 2024-05-21 DIAGNOSIS — N3000 Acute cystitis without hematuria: Secondary | ICD-10-CM

## 2024-05-21 DIAGNOSIS — G8194 Hemiplegia, unspecified affecting left nondominant side: Secondary | ICD-10-CM | POA: Diagnosis not present

## 2024-05-21 DIAGNOSIS — R600 Localized edema: Secondary | ICD-10-CM | POA: Diagnosis not present

## 2024-05-21 DIAGNOSIS — Z79891 Long term (current) use of opiate analgesic: Secondary | ICD-10-CM

## 2024-05-21 DIAGNOSIS — R2681 Unsteadiness on feet: Secondary | ICD-10-CM

## 2024-05-21 LAB — POCT URINALYSIS DIP (CLINITEK)
Bilirubin, UA: NEGATIVE
Blood, UA: NEGATIVE
Glucose, UA: NEGATIVE mg/dL
Ketones, POC UA: NEGATIVE mg/dL
Nitrite, UA: POSITIVE — AB
POC PROTEIN,UA: NEGATIVE
Spec Grav, UA: 1.02
Urobilinogen, UA: 0.2 U/dL
pH, UA: 6

## 2024-05-21 MED ORDER — SULFAMETHOXAZOLE-TRIMETHOPRIM 800-160 MG PO TABS: 1.0000 | ORAL_TABLET | Freq: Two times a day (BID) | ORAL | 2 refills | Status: AC

## 2024-05-21 NOTE — Progress Notes (Signed)
 "  Established Patient Office Visit  Subjective:  Patient ID: April Dickerson, female    DOB: 1941-04-07  Age: 84 y.o. MRN: [id number]  Chief Complaint  Patient presents with   Medical Management of Chronic Issues    HPI  Discussed the use of AI scribe software for clinical note transcription with the patient, who Dickerson verbal consent to proceed.  History of Present Illness   April Dickerson is an 84 year old female with hx of hydrocephalus who presents with urinary frequency and gait instability.  She experiences urinary frequency, particularly nocturia, with episodes occurring up to six times per night. Despite urinating, she feels as though she has not fully emptied her bladder. There is no dysuria, hematuria, or changes in urine appearance or odor. A recent urine culture was inconclusive due to contamination. Her fluid intake includes two eight-ounce glasses of water daily. She is concerned with the amount of urine produced despite minimal fluid intake. She is concerned today with another UTI. She reports her toilet is too low complicating her ability to go, and the raised toilet seat that was ordered for her has not yet been installed.  Her history of hydrocephalus includes neurosurgeries in 1988, 1989, and 1992, with no shunt placement due to high protein levels. She experiences gait instability and uses a walker. Her left foot is weak, and she has balance difficulties, often needing to lean forward for stability. She describes a sensation of 'floating in water' and has not had recent MRIs due to claustrophobia and equipment limitations. A CT scan two months ago showed no significant findings and no evidence of hydrocephalus.  She reports intermittent swelling in her legs, particularly in the distal lower extremities. The swelling is intermittent, and she notes that her right side is generally larger than her left due to her L hemiparesis and limited use of the leg. But recently, the  L calf has become painful and the ankle is edematous. She denies any history of blood clots.  Her current medications include hydrocodone  as needed, Xalatan eye drops, zinc 250 mg twice daily, cod liver oil gel caps, and Ocuvite. She has reduced her hydrocodone  use to almost one per day.  She lives in an apartment not well-suited for her needs, originally designed for a man in a wheelchair. Her son is in [place] , and her daughter is in [place]. She has limited support locally, relying on friends for assistance.      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 11/02/2014   Chronic angle-closure glaucoma of both eyes, mild stage 09/29/2014   Nuclear sclerotic cataract of both eyes 09/29/2014   Foot drop, left 05/16/2014   Abnormal electrocardiogram 11/18/2013   Postconcussion syndrome 06/22/2013   Hemiparesis, left (HCC) 06/22/2013   Headache 06/22/2013   Chronic pain syndrome 06/22/2013   Neuralgia 05/15/2013   AMD (age related macular degeneration) 12/28/2011   Trigger middle finger of right hand 12/25/2011   Osteoarthritis of right knee 12/25/2011   RENAL CYST 03/27/2010   Hypothyroidism 04/19/2009   HYPERTENSION, BENIGN 04/19/2009   Drop foot gait 03/31/1988   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  Weakness of left side of body    chronic/status post brain surgery   Past Surgical History:  Procedure Laterality Date   ANKLE SURGERY     BRAIN SURGERY     CHOLECYSTECTOMY     rt knee surgery     surgical repair of ligaments X 2   TUBAL LIGATION     Social History[1]    ROS: as noted in  HPI  Objective:     BP 128/72   Pulse 75   Ht 5' 7 (1.702 m)   Wt 168 lb (76.2 kg)   SpO2 98%   BMI 26.31 kg/m  BP Readings from Last 3 Encounters:  05/21/24 128/72  03/27/24 128/72  02/04/23 (!) 153/73   Wt Readings from Last 3 Encounters:  05/21/24 168 lb (76.2 kg)  03/27/24 168 lb (76.2 kg)  02/04/23 178 lb 12.8 oz (81.1 kg)      Physical Exam Vitals and nursing note reviewed. Exam conducted with a chaperone present.  Constitutional:      General: She is not in acute distress.    Appearance: Normal appearance. She is not ill-appearing, toxic-appearing or diaphoretic.  HENT:     Head: Normocephalic and atraumatic.  Eyes:     General:        Right eye: No discharge.        Left eye: No discharge.     Extraocular Movements: Extraocular movements intact.     Pupils: Pupils are equal, round, and reactive to light.  Cardiovascular:     Rate and Rhythm: Normal rate and regular rhythm.  Pulmonary:     Effort: Pulmonary effort is normal. No respiratory distress.  Abdominal:     General: Abdomen is flat. There is no distension.     Palpations: Abdomen is soft.     Tenderness: There is no abdominal tenderness. There is no right CVA tenderness, left CVA tenderness, guarding or rebound.  Musculoskeletal:        General: Deformity (kyphosis noted to C spine. L ankle is inverted) present.     Cervical back: No rigidity or tenderness.     Right lower leg: No edema (negative homan sign).     Left lower leg: Edema (+2 pitting edema L ankle only with positive homan sign in L calf) present.     Comments: R calf measures 15.5 L calf measures 15  Lymphadenopathy:     Cervical: No cervical adenopathy.  Skin:    General: Skin is warm and dry.     Coloration: Skin is not jaundiced.     Findings: No bruising, erythema or rash.  Neurological:     Mental Status: She is alert and oriented to person, place, and time.     Gait: Gait abnormal (using walker).      Results for  orders placed or performed in visit on 05/21/24  POCT URINALYSIS DIP (CLINITEK)  Result Value Ref Range   Color, UA yellow yellow   Clarity, UA clear clear   Glucose, UA negative negative mg/dL   Bilirubin, UA negative negative   Ketones, POC UA negative negative mg/dL   Spec Grav, UA 8.979 8.989 - 1.025   Blood, UA negative negative   pH, UA 6.0 5.0 - 8.0   POC PROTEIN,UA negative negative, trace   Urobilinogen, UA 0.2 0.2 or 1.0 E.U./dL   Nitrite, UA Positive (A) Negative   Leukocytes, UA Small (1+) (A) Negative    Last CBC Lab Results  Component Value Date  WBC 5.6 10/10/2021   HGB 12.0 10/10/2021   HCT 35.5 10/10/2021   MCV 92.0 10/10/2021   MCH 31.1 10/10/2021   RDW 14.0 10/10/2021   PLT 248 10/10/2021   Last metabolic panel Lab Results  Component Value Date   GLUCOSE 85 10/10/2021   NA 139 10/10/2021   K 4.5 10/10/2021   CL 106 10/10/2021   CO2 26 10/10/2021   BUN 20 10/10/2021   CREATININE 0.64 10/10/2021   EGFR 89 10/10/2021   CALCIUM 9.7 10/10/2021   PROT 6.4 10/10/2021   ALBUMIN 3.9 09/14/2013   BILITOT 0.5 10/10/2021   ALKPHOS 49 09/14/2013   AST 13 10/10/2021   ALT 11 10/10/2021   Last lipids Lab Results  Component Value Date   CHOL 159 10/10/2021   HDL 51 10/10/2021   LDLCALC 87 10/10/2021   TRIG 112 10/10/2021   CHOLHDL 3.1 10/10/2021   Last hemoglobin A1c No results found for: HGBA1C Last thyroid functions Lab Results  Component Value Date   TSH 4.094 09/14/2013   FREET4 1.19 09/14/2013   Last vitamin D No results found for: 25OHVITD2, 25OHVITD3, VD25OH Last vitamin B12 and Folate No results found for: VITAMINB12, FOLATE    The ASCVD Risk score (Arnett DK, et al., 2019) failed to calculate for the following reasons:   The 2019 ASCVD risk score is only valid for ages 80 to 73   * - Cholesterol units were assumed  Assessment & Plan:  Urinary frequency -     POCT URINALYSIS DIP (CLINITEK) -     Urine Culture -      POCT URINALYSIS DIP (CLINITEK) -     CMP14+EGFR -     CBC with Differential/Platelet -     Arginine vasopressin  hormone -     Hemoglobin A1c -     TSH + free T4 -     US  RENAL; Future  Acute cystitis without hematuria -     POCT URINALYSIS DIP (CLINITEK) -     Urine Culture -     Urine Culture -     Sulfamethoxazole -Trimethoprim ; Take 1 tablet by mouth 2 (two) times daily for 7 days.  Dispense: 14 tablet; Refill: 2  Edema of left lower extremity -     CMP14+EGFR -     Brain natriuretic peptide -     US  Venous Img Upper Uni Left (DVT); Future  Long term (current) use of opiate analgesic -     Hemoglobin A1c  Urinary tract infection without hematuria, site unspecified -     US  RENAL; Future  Gait instability -     Ambulatory referral to Home Health  Foot drop, left -     Ambulatory referral to Home Health  Hemiparesis, left (HCC)  Assessment and Plan    Urinary frequency and lower urinary tract symptoms Persistent urinary frequency with nocturia. Urine culture at last visit contaminated, no specific bacteria. Current UA dip concerning for UTI. Differential includes hydrocephalus, bladder issues, inflammation, inappropriate hormone secretion, or bladder cancer. Hydrocephalus considered due to history and symptoms. - Obtained urine sample for further analysis. - Ordered lab workup for underlying causes. - recent CT negative, however MRI more definitive. Discussed open MRI with [facility], pt declined  Edema of left lower extremity Unilateral swelling with pain. - Ordered ultrasound of the left lower extremity to rule out DVT.  Gait instability and left foot drop Gait instability with left foot drop. Related to hydrocephalus and other neurological issues. - Continue  follow-up with neurologist Dr. Dannielle. - home PT ordered to ensure continued movement/ use and strength training  History of hydrocephalus Previous neurosurgery, no shunt due to high protein levels. Symptoms may  relate to hydrocephalus. CT scan showed no significant hydrocephalus. MRI preferred but not feasible due to claustrophobia. - Discussed open MRI options - Consider medication for MRI, deferred by patient         Return in about 6 weeks (around 07/02/2024).   April LITTIE Gave, PA    [1]  Social History Tobacco Use   Smoking status: Never   Smokeless tobacco: Never  Vaping Use   Vaping status: Never Used  Substance Use Topics   Alcohol use: Not Currently   Drug use: No   "

## 2024-05-21 NOTE — Patient Instructions (Addendum)
 Your urine again looks like there is an infection. I have refilled your bactrim . Take twice daily with plenty of water. Please also get an ultrasound of your kidneys/ bladder to assess for retention.  Please also schedule an ultrasound of your left lower extremity to rule out a blood clot.  We will hold off on the MRI - it appears [facility] only have an open [facility]. We drew labs to assess your continued symptoms.  I will order home health to help with your gait and stability.   Please return to see me in 6 weeks

## 2024-05-22 ENCOUNTER — Ambulatory Visit: Payer: Self-pay | Admitting: Urgent Care

## 2024-05-22 ENCOUNTER — Encounter: Payer: Self-pay | Admitting: Urgent Care

## 2024-05-22 DIAGNOSIS — N2889 Other specified disorders of kidney and ureter: Secondary | ICD-10-CM

## 2024-05-22 DIAGNOSIS — N281 Cyst of kidney, acquired: Secondary | ICD-10-CM

## 2024-05-22 LAB — POCT URINALYSIS DIP (CLINITEK)

## 2024-05-25 LAB — URINE CULTURE

## 2024-05-26 ENCOUNTER — Ambulatory Visit

## 2024-05-26 ENCOUNTER — Other Ambulatory Visit: Payer: Self-pay | Admitting: Urgent Care

## 2024-05-26 ENCOUNTER — Telehealth: Payer: Self-pay

## 2024-05-26 ENCOUNTER — Ambulatory Visit: Payer: Self-pay | Admitting: Urgent Care

## 2024-05-26 DIAGNOSIS — R600 Localized edema: Secondary | ICD-10-CM

## 2024-05-26 DIAGNOSIS — R350 Frequency of micturition: Secondary | ICD-10-CM

## 2024-05-26 DIAGNOSIS — G8194 Hemiplegia, unspecified affecting left nondominant side: Secondary | ICD-10-CM

## 2024-05-26 DIAGNOSIS — M21372 Foot drop, left foot: Secondary | ICD-10-CM

## 2024-05-26 DIAGNOSIS — N3000 Acute cystitis without hematuria: Secondary | ICD-10-CM

## 2024-05-26 DIAGNOSIS — N390 Urinary tract infection, site not specified: Secondary | ICD-10-CM

## 2024-05-26 DIAGNOSIS — Z79891 Long term (current) use of opiate analgesic: Secondary | ICD-10-CM

## 2024-05-26 DIAGNOSIS — R2681 Unsteadiness on feet: Secondary | ICD-10-CM

## 2024-05-26 NOTE — Progress Notes (Signed)
 Attempted call to patient. Left a voce mail message requesting a return call.

## 2024-05-26 NOTE — Telephone Encounter (Signed)
 Attempted call to patient.  - left a voicemail message requesting a return call. - this is being addressed in Result notes response by provider - documented return call there as well.

## 2024-05-26 NOTE — Telephone Encounter (Signed)
 Copied from CRM #[id number]. Topic: Clinical - Lab/Test Results >> May 26, 2024  3:15 PM Montie POUR wrote: Reason for CRM:  Ms. Wojdyla wanted office to know to call her with the ultra sound results at [phone]. She does not use MyChart

## 2024-05-29 LAB — CMP14+EGFR
ALT: 11 IU/L (ref 0–32)
AST: 17 IU/L (ref 0–40)
Albumin: 4.1 g/dL (ref 3.7–4.7)
Alkaline Phosphatase: 70 IU/L (ref 48–129)
BUN/Creatinine Ratio: 26 (ref 12–28)
BUN: 22 mg/dL (ref 8–27)
Bilirubin Total: 0.7 mg/dL (ref 0.0–1.2)
CO2: 21 mmol/L (ref 20–29)
Calcium: 10 mg/dL (ref 8.7–10.3)
Chloride: 105 mmol/L (ref 96–106)
Creatinine, Ser: 0.86 mg/dL (ref 0.57–1.00)
Globulin, Total: 2.6 g/dL (ref 1.5–4.5)
Glucose: 84 mg/dL (ref 70–99)
Potassium: 4.8 mmol/L (ref 3.5–5.2)
Sodium: 141 mmol/L (ref 134–144)
Total Protein: 6.7 g/dL (ref 6.0–8.5)
eGFR: 67 mL/min/1.73

## 2024-05-29 LAB — CBC WITH DIFFERENTIAL/PLATELET
Basophils Absolute: 0.1 x10E3/uL (ref 0.0–0.2)
Basos: 1 %
EOS (ABSOLUTE): 0.1 x10E3/uL (ref 0.0–0.4)
Eos: 2 %
Hematocrit: 37.4 % (ref 34.0–46.6)
Hemoglobin: 12.5 g/dL (ref 11.1–15.9)
Immature Grans (Abs): 0 x10E3/uL (ref 0.0–0.1)
Immature Granulocytes: 0 %
Lymphocytes Absolute: 1.6 x10E3/uL (ref 0.7–3.1)
Lymphs: 22 %
MCH: 32 pg (ref 26.6–33.0)
MCHC: 33.4 g/dL (ref 31.5–35.7)
MCV: 96 fL (ref 79–97)
Monocytes Absolute: 0.4 x10E3/uL (ref 0.1–0.9)
Monocytes: 5 %
Neutrophils Absolute: 4.8 x10E3/uL (ref 1.4–7.0)
Neutrophils: 70 %
Platelets: 223 x10E3/uL (ref 150–450)
RBC: 3.91 x10E6/uL (ref 3.77–5.28)
RDW: 12.8 % (ref 11.7–15.4)
WBC: 6.9 x10E3/uL (ref 3.4–10.8)

## 2024-05-29 LAB — HEMOGLOBIN A1C
Est. average glucose Bld gHb Est-mCnc: 105 mg/dL
Hgb A1c MFr Bld: 5.3 % (ref 4.8–5.6)

## 2024-05-29 LAB — TSH+FREE T4
Free T4: 1.22 ng/dL (ref 0.82–1.77)
TSH: 2.4 u[IU]/mL (ref 0.450–4.500)

## 2024-05-29 LAB — ARGININE VASOPRESSIN HORMONE
ADH: 3.1 pg/mL (ref 0.0–4.7)
Osmolality Meas: 288 mosm/kg (ref 280–301)

## 2024-05-29 LAB — BRAIN NATRIURETIC PEPTIDE: BNP: 92.2 pg/mL (ref 0.0–100.0)

## 2024-06-01 NOTE — Progress Notes (Signed)
 Spoke to patient  regarding CT - she  states she is allergic to contrast dye ( all of them )  and this is ordered as with and without contrast - states she can not do any Contrast mediums. - wanting to know if he order should be changed.

## 2024-06-01 NOTE — Progress Notes (Signed)
 Ok yes. We didn't have that allergy listed. Will update the allergy and reorder the test. thanks

## 2024-06-10 ENCOUNTER — Other Ambulatory Visit

## 2024-06-17 ENCOUNTER — Other Ambulatory Visit

## 2024-07-01 ENCOUNTER — Ambulatory Visit: Admitting: Urgent Care
